# Patient Record
Sex: Female | Born: 1990
Health system: Southern US, Community
[De-identification: ages and names within clinical notes are randomized; demographics above are authoritative.]

## PROBLEM LIST (undated history)

## (undated) ENCOUNTER — Inpatient Hospital Stay (HOSPITAL_COMMUNITY): Payer: Self-pay

## (undated) DIAGNOSIS — N189 Chronic kidney disease, unspecified: Secondary | ICD-10-CM

## (undated) DIAGNOSIS — I10 Essential (primary) hypertension: Secondary | ICD-10-CM

## (undated) DIAGNOSIS — IMO0002 Reserved for concepts with insufficient information to code with codable children: Secondary | ICD-10-CM

## (undated) DIAGNOSIS — G43909 Migraine, unspecified, not intractable, without status migrainosus: Secondary | ICD-10-CM

## (undated) DIAGNOSIS — M329 Systemic lupus erythematosus, unspecified: Secondary | ICD-10-CM

## (undated) HISTORY — PX: TONSILLECTOMY: SUR1361

---

## 2013-04-27 ENCOUNTER — Encounter (HOSPITAL_COMMUNITY): Payer: Self-pay | Admitting: Emergency Medicine

## 2013-04-27 DIAGNOSIS — R109 Unspecified abdominal pain: Secondary | ICD-10-CM | POA: Insufficient documentation

## 2013-04-27 DIAGNOSIS — J3489 Other specified disorders of nose and nasal sinuses: Secondary | ICD-10-CM | POA: Insufficient documentation

## 2013-04-27 DIAGNOSIS — Z79899 Other long term (current) drug therapy: Secondary | ICD-10-CM | POA: Insufficient documentation

## 2013-04-27 DIAGNOSIS — Z3202 Encounter for pregnancy test, result negative: Secondary | ICD-10-CM | POA: Insufficient documentation

## 2013-04-27 DIAGNOSIS — J069 Acute upper respiratory infection, unspecified: Secondary | ICD-10-CM | POA: Insufficient documentation

## 2013-04-27 DIAGNOSIS — B9789 Other viral agents as the cause of diseases classified elsewhere: Secondary | ICD-10-CM | POA: Insufficient documentation

## 2013-04-27 LAB — COMPREHENSIVE METABOLIC PANEL
ALT: 8 U/L (ref 0–35)
AST: 18 U/L (ref 0–37)
Albumin: 2.8 g/dL — ABNORMAL LOW (ref 3.5–5.2)
Alkaline Phosphatase: 112 U/L (ref 39–117)
BUN: 8 mg/dL (ref 6–23)
CO2: 24 mEq/L (ref 19–32)
Calcium: 9.1 mg/dL (ref 8.4–10.5)
Chloride: 104 mEq/L (ref 96–112)
Creatinine, Ser: 0.62 mg/dL (ref 0.50–1.10)
GFR calc Af Amer: >90 mL/min (ref >90)
GFR calc non Af Amer: >90 mL/min (ref >90)
Glucose, Bld: 90 mg/dL (ref 70–99)
Potassium: 4 mEq/L (ref 3.5–5.1)
Sodium: 137 mEq/L (ref 135–145)
Total Bilirubin: 0.1 mg/dL — ABNORMAL LOW (ref 0.3–1.2)
Total Protein: 7.5 g/dL (ref 6.0–8.3)

## 2013-04-27 LAB — POCT PREGNANCY, URINE: Preg Test, Ur: NEGATIVE

## 2013-04-27 NOTE — ED Notes (Signed)
Pt is up to the desk c/o generalized abd pain and is concerned she may be pregnant, pt unsure of her last menstrual cycle, states "my last menstrual cycle was in December or January." Pt reports she was using Depo for [facility] and her last Depo injection was 2 weeks ago

## 2013-04-27 NOTE — ED Notes (Signed)
PT. REPORTS LEFT EAR ACHE WITH NASAL CONGESTION / WATERY EYES FOR SEVERAL DAYS , DENIES FEVER OR CHILLS.

## 2013-04-28 ENCOUNTER — Emergency Department (HOSPITAL_COMMUNITY)
Admission: EM | Admit: 2013-04-28 | Discharge: 2013-04-28 | Disposition: A | Discharge status: Home / Self Care | Payer: BC Managed Care – PPO | Attending: Emergency Medicine | Admitting: Emergency Medicine

## 2013-04-28 DIAGNOSIS — B349 Viral infection, unspecified: Secondary | ICD-10-CM

## 2013-04-28 DIAGNOSIS — J069 Acute upper respiratory infection, unspecified: Secondary | ICD-10-CM

## 2013-04-28 LAB — URINALYSIS, ROUTINE W REFLEX MICROSCOPIC
Bilirubin Urine: NEGATIVE
Glucose, UA: NEGATIVE mg/dL
Hgb urine dipstick: NEGATIVE
Ketones, ur: NEGATIVE mg/dL
Leukocytes, UA: NEGATIVE
Nitrite: NEGATIVE
Protein, ur: >300 mg/dL — AB
Specific Gravity, Urine: 1.02 (ref 1.005–1.030)
Urobilinogen, UA: 1 mg/dL (ref 0.0–1.0)
pH: 7 (ref 5.0–8.0)

## 2013-04-28 LAB — CBC WITH DIFFERENTIAL/PLATELET
Basophils Absolute: 0.1 10*3/uL (ref 0.0–0.1)
Basophils Relative: 1 % (ref 0–1)
Eosinophils Absolute: 1.8 10*3/uL — ABNORMAL HIGH (ref 0.0–0.7)
Eosinophils Relative: 16 % — ABNORMAL HIGH (ref 0–5)
HCT: 32.7 % — ABNORMAL LOW (ref 36.0–46.0)
Hemoglobin: 10.9 g/dL — ABNORMAL LOW (ref 12.0–15.0)
Lymphocytes Relative: 28 % (ref 12–46)
Lymphs Abs: 3.1 10*3/uL (ref 0.7–4.0)
MCH: 25.4 pg — ABNORMAL LOW (ref 26.0–34.0)
MCHC: 33.3 g/dL (ref 30.0–36.0)
MCV: 76.2 fL — ABNORMAL LOW (ref 78.0–100.0)
Monocytes Absolute: 1.1 10*3/uL — ABNORMAL HIGH (ref 0.1–1.0)
Monocytes Relative: 10 % (ref 3–12)
Neutro Abs: 5 10*3/uL (ref 1.7–7.7)
Neutrophils Relative %: 45 % (ref 43–77)
Platelets: 270 10*3/uL (ref 150–400)
RBC: 4.29 MIL/uL (ref 3.87–5.11)
RDW: 14.9 % (ref 11.5–15.5)
WBC: 11.1 10*3/uL — ABNORMAL HIGH (ref 4.0–10.5)

## 2013-04-28 LAB — URINE MICROSCOPIC-ADD ON

## 2013-04-28 MED ORDER — OXYMETAZOLINE HCL 0.05 % NA SOLN
1.0000 | Freq: Once | NASAL | Status: AC
  Administered 2013-04-28: 1 via NASAL
  Filled 2013-04-28: qty 15

## 2013-04-28 MED ORDER — OXYMETAZOLINE HCL 0.05 % NA SOLN: 2.0000 | Freq: Two times a day (BID) | NASAL | Status: DC

## 2013-04-28 MED ORDER — PSEUDOEPHEDRINE HCL ER 120 MG PO TB12: 120.0000 mg | ORAL_TABLET | Freq: Two times a day (BID) | ORAL | Status: DC

## 2013-04-28 MED ORDER — PSEUDOEPHEDRINE HCL ER 120 MG PO TB12
120.0000 mg | ORAL_TABLET | Freq: Once | ORAL | Status: AC
  Administered 2013-04-28: 120 mg via ORAL
  Filled 2013-04-28: qty 1

## 2013-04-28 NOTE — ED Notes (Signed)
MD at bedside. 

## 2013-04-28 NOTE — ED Notes (Signed)
The patient is AOx4 and comfortable with her discharge instructions. 

## 2013-04-29 NOTE — ED Provider Notes (Signed)
History     CSN: [id number]  Arrival date & time 04/27/13  2128   First MD Initiated Contact with Patient 04/28/13 [phone]      Chief Complaint  Patient presents with  . Otalgia  . Nasal Congestion  . Abdominal Pain    (Consider location/radiation/quality/duration/timing/severity/associated sxs/prior treatment) HPI 22 yo female presents to the ER with c/o 2-3 days of nasal congestion, watery eyes.  She has had sore throat, worse when lying down.  Today developed left ear pain.  Subjective fevers.  No cough.  No chest pain.  No allergies.  No sick contacts.  Pt also c/o no menses for several months.  No longer on Depo.  History reviewed. No pertinent past medical history.  History reviewed. No pertinent past surgical history.  No family history on file.  History  Substance Use Topics  . Smoking status: Never Smoker   . Smokeless tobacco: Not on file  . Alcohol Use: No    OB History   Grav Para Term Preterm Abortions TAB SAB Ect Mult Living                  Review of Systems  All other systems reviewed and are negative.    Allergies  Review of patient's allergies indicates no known allergies.  Home Medications   Current Outpatient Rx  Name  Route  Sig  Dispense  Refill  . OVER THE COUNTER MEDICATION   Oral   Take 1 tablet by mouth daily. Sinus medication         . oxymetazoline (AFRIN) 0.05 % nasal spray   Nasal   Place 2 sprays into the nose 2 (two) times daily.   30 mL   0   . pseudoephedrine (SUDAFED) 120 MG 12 hr tablet   Oral   Take 1 tablet (120 mg total) by mouth 2 (two) times daily.   60 tablet   0     BP 104/64  Pulse 66  Temp(Src) 97.8 F (36.6 C) (Oral)  Resp 19  Ht 5' 1.5" (1.562 m)  Wt 140 lb (63.504 kg)  BMI 26.03 kg/m2  SpO2 99%  Physical Exam  Nursing note and vitals reviewed. Constitutional: She is oriented to person, place, and time. She appears well-developed and well-nourished.  HENT:  Head: Normocephalic and  atraumatic.  Right Ear: External ear normal.  Left Ear: External ear normal.  Mouth/Throat: Oropharynx is clear and moist.  rhinorrhea  Eyes: Conjunctivae and EOM are normal. Pupils are equal, round, and reactive to light.  Neck: Normal range of motion. Neck supple. No JVD present. No tracheal deviation present. No thyromegaly present.  Cardiovascular: Normal rate, regular rhythm, normal heart sounds and intact distal pulses.  Exam reveals no gallop and no friction rub.   No murmur heard. Pulmonary/Chest: Effort normal and breath sounds normal. No stridor. No respiratory distress. She has no wheezes. She has no rales. She exhibits no tenderness.  Abdominal: Soft. Bowel sounds are normal. She exhibits no distension and no mass. There is no tenderness. There is no rebound and no guarding.  Musculoskeletal: Normal range of motion. She exhibits no edema and no tenderness.  Lymphadenopathy:    She has no cervical adenopathy.  Neurological: She is alert and oriented to person, place, and time. She exhibits normal muscle tone. Coordination normal.  Skin: Skin is warm and dry. No rash noted. No erythema. No pallor.  Psychiatric: She has a normal mood and affect. Her behavior is normal. Judgment  and thought content normal.    ED Course  Procedures (including critical care time)  Labs Reviewed  CBC WITH DIFFERENTIAL - Abnormal; Notable for the following:    WBC 11.1 (*)    Hemoglobin 10.9 (*)    HCT 32.7 (*)    MCV 76.2 (*)    MCH 25.4 (*)    Eosinophils Relative 16 (*)    Monocytes Absolute 1.1 (*)    Eosinophils Absolute 1.8 (*)    All other components within normal limits  COMPREHENSIVE METABOLIC PANEL - Abnormal; Notable for the following:    Albumin 2.8 (*)    Total Bilirubin 0.1 (*)    All other components within normal limits  URINALYSIS, ROUTINE W REFLEX MICROSCOPIC - Abnormal; Notable for the following:    Protein, ur >300 (*)    All other components within normal limits  URINE  MICROSCOPIC-ADD ON - Abnormal; Notable for the following:    Squamous Epithelial / LPF FEW (*)    All other components within normal limits  POCT PREGNANCY, URINE   No results found.   1. Viral illness   2. Upper respiratory infection       MDM  Pt with URI by history and exam.  Pt educated on expected course and treatment.  Urine pregnancy is negative.       Olivia Mackie, MD 04/29/13 1351

## 2014-04-22 ENCOUNTER — Other Ambulatory Visit (HOSPITAL_COMMUNITY)
Admission: RE | Admit: 2014-04-22 | Discharge: 2014-04-22 | Disposition: A | Discharge status: Home / Self Care | Payer: BC Managed Care – PPO | Source: Ambulatory Visit | Attending: Nurse Practitioner | Admitting: Nurse Practitioner

## 2014-04-22 ENCOUNTER — Other Ambulatory Visit: Payer: Self-pay | Admitting: Nurse Practitioner

## 2014-04-22 DIAGNOSIS — Z124 Encounter for screening for malignant neoplasm of cervix: Secondary | ICD-10-CM | POA: Insufficient documentation

## 2014-04-22 DIAGNOSIS — Z113 Encounter for screening for infections with a predominantly sexual mode of transmission: Secondary | ICD-10-CM | POA: Insufficient documentation

## 2014-11-25 ENCOUNTER — Emergency Department (HOSPITAL_COMMUNITY)
Admission: EM | Admit: 2014-11-25 | Discharge: 2014-11-25 | Disposition: A | Discharge status: Home / Self Care | Payer: BC Managed Care – PPO | Attending: Emergency Medicine | Admitting: Emergency Medicine

## 2014-11-25 ENCOUNTER — Emergency Department (HOSPITAL_COMMUNITY): Payer: BC Managed Care – PPO

## 2014-11-25 DIAGNOSIS — R1030 Lower abdominal pain, unspecified: Secondary | ICD-10-CM | POA: Diagnosis present

## 2014-11-25 DIAGNOSIS — Z3202 Encounter for pregnancy test, result negative: Secondary | ICD-10-CM | POA: Insufficient documentation

## 2014-11-25 DIAGNOSIS — N739 Female pelvic inflammatory disease, unspecified: Secondary | ICD-10-CM | POA: Diagnosis not present

## 2014-11-25 DIAGNOSIS — N938 Other specified abnormal uterine and vaginal bleeding: Secondary | ICD-10-CM | POA: Diagnosis not present

## 2014-11-25 DIAGNOSIS — Z79899 Other long term (current) drug therapy: Secondary | ICD-10-CM | POA: Insufficient documentation

## 2014-11-25 LAB — COMPREHENSIVE METABOLIC PANEL
ALT: 11 U/L (ref 0–35)
AST: 19 U/L (ref 0–37)
Albumin: 3.3 g/dL — ABNORMAL LOW (ref 3.5–5.2)
Alkaline Phosphatase: 90 U/L (ref 39–117)
Anion gap: 5 (ref 5–15)
BUN: 11 mg/dL (ref 6–23)
CO2: 23 mmol/L (ref 19–32)
Calcium: 9 mg/dL (ref 8.4–10.5)
Chloride: 111 mEq/L (ref 96–112)
Creatinine, Ser: 0.49 mg/dL — ABNORMAL LOW (ref 0.50–1.10)
GFR calc Af Amer: >90 mL/min (ref >90)
GFR calc non Af Amer: >90 mL/min (ref >90)
Glucose, Bld: 90 mg/dL (ref 70–99)
Potassium: 3.4 mmol/L — ABNORMAL LOW (ref 3.5–5.1)
Sodium: 139 mmol/L (ref 135–145)
Total Bilirubin: 0.4 mg/dL (ref 0.3–1.2)
Total Protein: 7.2 g/dL (ref 6.0–8.3)

## 2014-11-25 LAB — CBC WITH DIFFERENTIAL/PLATELET
Basophils Absolute: 0 10*3/uL (ref 0.0–0.1)
Basophils Relative: 0 % (ref 0–1)
Eosinophils Absolute: 1.3 10*3/uL — ABNORMAL HIGH (ref 0.0–0.7)
Eosinophils Relative: 11 % — ABNORMAL HIGH (ref 0–5)
HCT: 32.5 % — ABNORMAL LOW (ref 36.0–46.0)
Hemoglobin: 10.3 g/dL — ABNORMAL LOW (ref 12.0–15.0)
Lymphocytes Relative: 18 % (ref 12–46)
Lymphs Abs: 2.2 10*3/uL (ref 0.7–4.0)
MCH: 24.9 pg — ABNORMAL LOW (ref 26.0–34.0)
MCHC: 31.7 g/dL (ref 30.0–36.0)
MCV: 78.7 fL (ref 78.0–100.0)
Monocytes Absolute: 0.7 10*3/uL (ref 0.1–1.0)
Monocytes Relative: 6 % (ref 3–12)
Neutro Abs: 8.3 10*3/uL — ABNORMAL HIGH (ref 1.7–7.7)
Neutrophils Relative %: 65 % (ref 43–77)
Platelets: 299 10*3/uL (ref 150–400)
RBC: 4.13 MIL/uL (ref 3.87–5.11)
RDW: 15.7 % — ABNORMAL HIGH (ref 11.5–15.5)
WBC: 12.6 10*3/uL — ABNORMAL HIGH (ref 4.0–10.5)

## 2014-11-25 LAB — URINALYSIS, ROUTINE W REFLEX MICROSCOPIC
Bilirubin Urine: NEGATIVE
Glucose, UA: NEGATIVE mg/dL
Ketones, ur: NEGATIVE mg/dL
Nitrite: NEGATIVE
Protein, ur: 30 mg/dL — AB
Specific Gravity, Urine: 1.019 (ref 1.005–1.030)
Urobilinogen, UA: 0.2 mg/dL (ref 0.0–1.0)
pH: 7.5 (ref 5.0–8.0)

## 2014-11-25 LAB — WET PREP, GENITAL
Clue Cells Wet Prep HPF POC: NONE SEEN
Trich, Wet Prep: NONE SEEN
WBC, Wet Prep HPF POC: NONE SEEN
Yeast Wet Prep HPF POC: NONE SEEN

## 2014-11-25 LAB — POC URINE PREG, ED: Preg Test, Ur: NEGATIVE

## 2014-11-25 LAB — URINE MICROSCOPIC-ADD ON

## 2014-11-25 MED ORDER — METRONIDAZOLE 500 MG PO TABS: 500.0000 mg | ORAL_TABLET | Freq: Two times a day (BID) | ORAL | Status: DC

## 2014-11-25 MED ORDER — DOXYCYCLINE HYCLATE 100 MG PO TABS
100.0000 mg | ORAL_TABLET | Freq: Once | ORAL | Status: AC
  Administered 2014-11-25: 100 mg via ORAL
  Filled 2014-11-25: qty 1

## 2014-11-25 MED ORDER — DOXYCYCLINE HYCLATE 100 MG PO CAPS: 100.0000 mg | ORAL_CAPSULE | Freq: Two times a day (BID) | ORAL | Status: DC

## 2014-11-25 MED ORDER — METRONIDAZOLE 500 MG PO TABS
500.0000 mg | ORAL_TABLET | Freq: Once | ORAL | Status: AC
  Administered 2014-11-25: 500 mg via ORAL
  Filled 2014-11-25: qty 1

## 2014-11-25 MED ORDER — CEFTRIAXONE SODIUM 250 MG IJ SOLR
250.0000 mg | Freq: Once | INTRAMUSCULAR | Status: AC
  Administered 2014-11-25: 250 mg via INTRAMUSCULAR
  Filled 2014-11-25: qty 250

## 2014-11-25 MED ORDER — HYDROCODONE-ACETAMINOPHEN 5-325 MG PO TABS
2.0000 | ORAL_TABLET | Freq: Once | ORAL | Status: AC
  Administered 2014-11-25: 2 via ORAL
  Filled 2014-11-25: qty 2

## 2014-11-25 MED ORDER — LIDOCAINE HCL 1 % IJ SOLN
INTRAMUSCULAR | Status: AC
  Administered 2014-11-25: 0.9 mL
  Filled 2014-11-25: qty 20

## 2014-11-25 MED ORDER — HYDROCODONE-ACETAMINOPHEN 5-325 MG PO TABS: 1.0000 | ORAL_TABLET | ORAL | Status: DC | PRN

## 2014-11-25 NOTE — Progress Notes (Signed)
  CARE MANAGEMENT ED NOTE 11/25/2014  Patient:  Schuyler AmorJONES,Marda   Account Number:  [id number]  Date Initiated:  11/25/2014  Documentation initiated by:  Radford PaxFERRERO,Collen Hostler  Subjective/Objective Assessment:   Patient presents to Ed with abdominal pain headache and vaginal bleeding     Subjective/Objective Assessment Detail:     Action/Plan:   Action/Plan Detail:   Anticipated DC Date:  11/25/2014     Status Recommendation to Physician:   Result of Recommendation:    Other ED Services  Consult Working Plan    DC Planning Services  Other  PCP issues    Choice offered to / List presented to:            Status of service:  Completed, signed off  ED Comments:   ED Comments Detail:  [facility]  [facility].  Patient listed as having [employer] insurnace without pcp. As per EDP, patient reports she does not have [employer]BS insurnace anymore and her new insurnace does nto start until Feb.   EDCM spoke to registration who reports patient is active with [employer] insurnace until 12/26/2014.  EDCM spke to patient at bedisde.  [facility].  Patient was not aware of this and reports [facility]  Patient reports her pcp is Dr. Eula ListenHussain.  EDCM unable to find this provider.  No further EDCM needs at this time.

## 2014-11-25 NOTE — ED Notes (Signed)
Pt c/o cramping lower abdominal pain, headache. Pt states she is currently having spotting vaginal bleeding, congruent with her irregular periods s/t oral contraceptives. Denies emesis, diarrhea, constipation.

## 2014-11-25 NOTE — ED Provider Notes (Signed)
CSN: [id number]     Arrival date & time 11/25/14  1347 History   First MD Initiated Contact with Patient 11/25/14 1356     Chief Complaint  Patient presents with  . Abdominal Pain     (Consider location/radiation/quality/duration/timing/severity/associated sxs/prior Treatment) HPI  23 year old female presents with 3 days of lower abdominal pain. Rates it as severe. The pain is constant. The pain has been in her lower abdomen and did not start anywhere else. Does not radiate. The pain is worst suprapubic/pelvic. Denies any nausea, vomiting, back pain, or fever. No diarrhea. She has irregular periods due to her birth control, did start to spot this morning. Is unsure if she's pregnant. Denies any vaginal discharge. Has tried ibuprofen without relief.  No past medical history on file. No past surgical history on file. No family history on file. History  Substance Use Topics  . Smoking status: Never Smoker   . Smokeless tobacco: Not on file  . Alcohol Use: No   OB History    No data available     Review of Systems  Constitutional: Negative for fever.  Gastrointestinal: Positive for abdominal pain. Negative for nausea, vomiting, diarrhea and constipation.  Genitourinary: Positive for vaginal bleeding. Negative for dysuria, hematuria, decreased urine volume and vaginal discharge.  Musculoskeletal: Negative for back pain.  All other systems reviewed and are negative.     Allergies  Review of patient's allergies indicates no known allergies.  Home Medications   Prior to Admission medications   Medication Sig Start Date End Date Taking? Authorizing Provider  OVER THE COUNTER MEDICATION Take 1 tablet by mouth daily. Sinus medication    Historical Provider, MD  oxymetazoline (AFRIN) 0.05 % nasal spray Place 2 sprays into the nose 2 (two) times daily. 04/28/13   Olivia Mackielga M Otter, MD  pseudoephedrine (SUDAFED) 120 MG 12 hr tablet Take 1 tablet (120 mg total) by mouth 2 (two) times daily.  04/28/13   Olivia Mackielga M Otter, MD   BP 140/85 mmHg  Pulse 90  Temp(Src) 98.4 F (36.9 C) (Oral)  Resp 16  SpO2 100%  LMP 11/25/2014 Physical Exam  Constitutional: She is oriented to person, place, and time. She appears well-developed and well-nourished.  HENT:  Head: Normocephalic and atraumatic.  Right Ear: External ear normal.  Left Ear: External ear normal.  Nose: Nose normal.  Eyes: Right eye exhibits no discharge. Left eye exhibits no discharge.  Cardiovascular: Normal rate, regular rhythm and normal heart sounds.   Pulmonary/Chest: Effort normal and breath sounds normal.  Abdominal: Soft. There is tenderness in the suprapubic area. There is no CVA tenderness.  Genitourinary: Uterus is tender. Uterus is not enlarged. Cervix exhibits motion tenderness, discharge and friability. Right adnexum displays no mass. Left adnexum displays no mass.  Neurological: She is alert and oriented to person, place, and time.  Skin: Skin is warm and dry.  Nursing note and vitals reviewed.   ED Course  Procedures (including critical care time) Labs Review Labs Reviewed  CBC WITH DIFFERENTIAL - Abnormal; Notable for the following:    WBC 12.6 (*)    Hemoglobin 10.3 (*)    HCT 32.5 (*)    MCH 24.9 (*)    RDW 15.7 (*)    Neutro Abs 8.3 (*)    Eosinophils Relative 11 (*)    Eosinophils Absolute 1.3 (*)    All other components within normal limits  COMPREHENSIVE METABOLIC PANEL - Abnormal; Notable for the following:    Potassium 3.4 (*)  Creatinine, Ser 0.49 (*)    Albumin 3.3 (*)    All other components within normal limits  URINALYSIS, ROUTINE W REFLEX MICROSCOPIC - Abnormal; Notable for the following:    Hgb urine dipstick MODERATE (*)    Protein, ur 30 (*)    Leukocytes, UA SMALL (*)    All other components within normal limits  WET PREP, GENITAL  GC/CHLAMYDIA PROBE AMP  URINE MICROSCOPIC-ADD ON  POC URINE PREG, ED    Imaging Review [place] Transvaginal Non-ob  11/25/2014   CLINICAL  DATA:  Pelvic pain for 2 days. Irregular menstrual bleeding. Pelvic inflammatory disease.  EXAM: TRANSABDOMINAL AND TRANSVAGINAL ULTRASOUND OF PELVIS  TECHNIQUE: Both transabdominal and transvaginal ultrasound examinations of the pelvis were performed. Transabdominal technique was performed for global imaging of the pelvis including uterus, ovaries, adnexal regions, and pelvic cul-de-sac. It was necessary to proceed with endovaginal exam following the transabdominal exam to visualize the uterus, ovaries, and adnexa.  COMPARISON:  None  FINDINGS: Uterus  Measurements: Retroverted measuring 7.8 x 3.8 x 4.0 cm. No fibroids or other mass visualized. The uterus is best assessed transvaginally.  Endometrium  Thickness: 3 mm.  No focal abnormality visualized.  Right ovary  Measurements: 4.0 x 2.3 x 2.8 cm. There is a follicular 1.4 cm cyst. Normal blood flow seen. Adjacent to the right ovary in the right adnexa is 1.7 x 1.6 x 2.0 cm complex regional with peripheral blood flow and small central hypoechoic region as well as an adjacent tubular echogenic structure.  Left ovary  Measurements: 3.0 x 1.4 3.1 cm. There is a 1.5 cm follicular cyst. Normal blood flow. No adnexal mass.  Other findings  Small amount of simple free fluid.  IMPRESSION: 1. Complex right adnexal process with echogenic tubular structure and complex region adjacent to the right ovary that may reflect a small 2 cm abscess, given the clinical concern for pelvic inflammatory disease. Small amount of simple free fluid in the pelvis. 2. There is normal blood flow to both ovaries, no torsion. Retroverted but normal appearance of the uterus.   Electronically Signed   By: Rubye OaksMelanie  Ehinger M.D.   On: 11/25/2014 15:55   [place] Pelvis Complete  11/25/2014   CLINICAL DATA:  Pelvic pain for 2 days. Irregular menstrual bleeding. Pelvic inflammatory disease.  EXAM: TRANSABDOMINAL AND TRANSVAGINAL ULTRASOUND OF PELVIS  TECHNIQUE: Both transabdominal and transvaginal  ultrasound examinations of the pelvis were performed. Transabdominal technique was performed for global imaging of the pelvis including uterus, ovaries, adnexal regions, and pelvic cul-de-sac. It was necessary to proceed with endovaginal exam following the transabdominal exam to visualize the uterus, ovaries, and adnexa.  COMPARISON:  None  FINDINGS: Uterus  Measurements: Retroverted measuring 7.8 x 3.8 x 4.0 cm. No fibroids or other mass visualized. The uterus is best assessed transvaginally.  Endometrium  Thickness: 3 mm.  No focal abnormality visualized.  Right ovary  Measurements: 4.0 x 2.3 x 2.8 cm. There is a follicular 1.4 cm cyst. Normal blood flow seen. Adjacent to the right ovary in the right adnexa is 1.7 x 1.6 x 2.0 cm complex regional with peripheral blood flow and small central hypoechoic region as well as an adjacent tubular echogenic structure.  Left ovary  Measurements: 3.0 x 1.4 3.1 cm. There is a 1.5 cm follicular cyst. Normal blood flow. No adnexal mass.  Other findings  Small amount of simple free fluid.  IMPRESSION: 1. Complex right adnexal process with echogenic tubular structure and complex region adjacent to  the right ovary that may reflect a small 2 cm abscess, given the clinical concern for pelvic inflammatory disease. Small amount of simple free fluid in the pelvis. 2. There is normal blood flow to both ovaries, no torsion. Retroverted but normal appearance of the uterus.   Electronically Signed   By: Rubye Oaks M.D.   On: 11/25/2014 15:55     EKG Interpretation None      MDM   Final diagnoses:  Pelvic inflammatory disease    Patient symptoms are consistent with PID. Her pain is controlled with oral narcotics. She has no rebound tenderness and does not appear ill. No fevers. Discussed ultrasound results, which show a small possible abscess, with OB/GYN, Dr. Jolayne Panther, who recommends oral antibiotics x 2 weeks at this time with oral pain control and f/u with GYN in 2  weeks. Patient agreeable to plan and understands strict return precautions.     Audree Camel, MD 11/25/14 [phone]

## 2014-11-28 LAB — GC/CHLAMYDIA PROBE AMP
CT Probe RNA: NEGATIVE
GC Probe RNA: POSITIVE — AB

## 2014-11-29 ENCOUNTER — Encounter: Payer: Self-pay | Admitting: Family Medicine

## 2014-11-29 ENCOUNTER — Telehealth: Payer: Self-pay

## 2014-11-29 DIAGNOSIS — N7093 Salpingitis and oophoritis, unspecified: Secondary | ICD-10-CM

## 2014-11-29 NOTE — Telephone Encounter (Signed)
F/u U/S scheduled for Tuesday 12/14/14 0915. Attempted to contact patient regarding appointment. Heard busy tone. Will attempt again later. Message sent to admin pool to schedule patient f/u GYN appointment for later that week or the following week; they will call patient with clinic appointment.

## 2014-11-29 NOTE — Telephone Encounter (Signed)
-----   Message from Catalina Antigua, MD sent at 11/25/2014  4:26 PM EST ----- Please schedule repeat ultrasound a few days prior to follow appointment (which should be scheduled in 3-4 weeks) for the evaluation of a right TOA  Thanks  Peggy

## 2014-11-30 ENCOUNTER — Encounter: Payer: Self-pay | Admitting: General Practice

## 2014-11-30 ENCOUNTER — Telehealth (HOSPITAL_BASED_OUTPATIENT_CLINIC_OR_DEPARTMENT_OTHER): Payer: Self-pay | Admitting: Emergency Medicine

## 2014-11-30 ENCOUNTER — Telehealth: Payer: Self-pay | Admitting: General Practice

## 2014-11-30 NOTE — Telephone Encounter (Signed)
Called patient and number had been disconnected. Called patient's contact number for mother, she answered stating she wasn't with Burnett HarryShelly but would tell her to give us a call. Will send letter

## 2014-11-30 NOTE — Telephone Encounter (Signed)
Patient called back returning our call. I informed patient of appointments and she stated she wants them cancelled she already has an ob/gyn. Discussed with patient importance of follow up. Patient verbalized understanding and had no questions

## 2014-12-08 ENCOUNTER — Telehealth (HOSPITAL_COMMUNITY): Payer: Self-pay

## 2014-12-08 NOTE — ED Notes (Signed)
Unable to contact pt by mail or telephone. Unable to communicate lab results or treatment changes. 

## 2014-12-14 ENCOUNTER — Ambulatory Visit (HOSPITAL_COMMUNITY): Payer: BC Managed Care – PPO

## 2014-12-14 ENCOUNTER — Encounter: Payer: Self-pay | Admitting: General Practice

## 2014-12-15 ENCOUNTER — Ambulatory Visit: Payer: Self-pay | Admitting: Obstetrics & Gynecology

## 2014-12-29 ENCOUNTER — Encounter: Payer: Self-pay | Admitting: Family Medicine

## 2015-01-24 ENCOUNTER — Telehealth (HOSPITAL_COMMUNITY): Payer: Self-pay

## 2015-01-24 NOTE — Telephone Encounter (Signed)
STD treatment requested [facility] 

## 2015-09-26 ENCOUNTER — Emergency Department (HOSPITAL_COMMUNITY)
Admission: EM | Admit: 2015-09-26 | Discharge: 2015-09-26 | Disposition: A | Discharge status: Home / Self Care | Payer: 59 | Attending: Emergency Medicine | Admitting: Emergency Medicine

## 2015-09-26 ENCOUNTER — Emergency Department (HOSPITAL_COMMUNITY): Payer: 59

## 2015-09-26 ENCOUNTER — Encounter (HOSPITAL_COMMUNITY): Payer: Self-pay | Admitting: Family Medicine

## 2015-09-26 DIAGNOSIS — R000 Tachycardia, unspecified: Secondary | ICD-10-CM | POA: Diagnosis not present

## 2015-09-26 DIAGNOSIS — Z79899 Other long term (current) drug therapy: Secondary | ICD-10-CM | POA: Insufficient documentation

## 2015-09-26 DIAGNOSIS — L298 Other pruritus: Secondary | ICD-10-CM | POA: Diagnosis not present

## 2015-09-26 DIAGNOSIS — R0602 Shortness of breath: Secondary | ICD-10-CM | POA: Diagnosis present

## 2015-09-26 DIAGNOSIS — Z3202 Encounter for pregnancy test, result negative: Secondary | ICD-10-CM | POA: Diagnosis not present

## 2015-09-26 DIAGNOSIS — J189 Pneumonia, unspecified organism: Secondary | ICD-10-CM

## 2015-09-26 DIAGNOSIS — J159 Unspecified bacterial pneumonia: Secondary | ICD-10-CM | POA: Insufficient documentation

## 2015-09-26 LAB — COMPREHENSIVE METABOLIC PANEL
ALT: 14 U/L (ref 14–54)
AST: 22 U/L (ref 15–41)
Albumin: 3 g/dL — ABNORMAL LOW (ref 3.5–5.0)
Alkaline Phosphatase: 72 U/L (ref 38–126)
Anion gap: 3 — ABNORMAL LOW (ref 5–15)
BUN: 10 mg/dL (ref 6–20)
CO2: 25 mmol/L (ref 22–32)
Calcium: 8.7 mg/dL — ABNORMAL LOW (ref 8.9–10.3)
Chloride: 108 mmol/L (ref 101–111)
Creatinine, Ser: 0.58 mg/dL (ref 0.44–1.00)
GFR calc Af Amer: >60 mL/min (ref >60)
GFR calc non Af Amer: >60 mL/min (ref >60)
Glucose, Bld: 109 mg/dL — ABNORMAL HIGH (ref 65–99)
Potassium: 3.4 mmol/L — ABNORMAL LOW (ref 3.5–5.1)
Sodium: 136 mmol/L (ref 135–145)
Total Bilirubin: 0.5 mg/dL (ref 0.3–1.2)
Total Protein: 6.8 g/dL (ref 6.5–8.1)

## 2015-09-26 LAB — CBC WITH DIFFERENTIAL/PLATELET
Basophils Absolute: 0 10*3/uL (ref 0.0–0.1)
Basophils Relative: 0 %
Eosinophils Absolute: 0.1 10*3/uL (ref 0.0–0.7)
Eosinophils Relative: 1 %
HCT: 37.3 % (ref 36.0–46.0)
Hemoglobin: 12.2 g/dL (ref 12.0–15.0)
Lymphocytes Relative: 21 %
Lymphs Abs: 2 10*3/uL (ref 0.7–4.0)
MCH: 27.1 pg (ref 26.0–34.0)
MCHC: 32.7 g/dL (ref 30.0–36.0)
MCV: 82.9 fL (ref 78.0–100.0)
Monocytes Absolute: 0.8 10*3/uL (ref 0.1–1.0)
Monocytes Relative: 9 %
Neutro Abs: 6.6 10*3/uL (ref 1.7–7.7)
Neutrophils Relative %: 69 %
Platelets: 216 10*3/uL (ref 150–400)
RBC: 4.5 MIL/uL (ref 3.87–5.11)
RDW: 16 % — ABNORMAL HIGH (ref 11.5–15.5)
WBC: 9.6 10*3/uL (ref 4.0–10.5)

## 2015-09-26 LAB — D-DIMER, QUANTITATIVE: D-Dimer, Quant: 1.69 ug/mL-FEU — ABNORMAL HIGH (ref 0.00–0.48)

## 2015-09-26 LAB — I-STAT BETA HCG BLOOD, ED (MC, WL, AP ONLY): I-stat hCG, quantitative: <5 m[IU]/mL (ref <5)

## 2015-09-26 MED ORDER — ALBUTEROL SULFATE HFA 108 (90 BASE) MCG/ACT IN AERS
1.0000 | INHALATION_SPRAY | RESPIRATORY_TRACT | Status: DC | PRN
Start: 2015-09-26 — End: 2015-09-26
  Administered 2015-09-26: 1 via RESPIRATORY_TRACT
  Filled 2015-09-26: qty 6.7

## 2015-09-26 MED ORDER — IOHEXOL 350 MG/ML SOLN
100.0000 mL | Freq: Once | INTRAVENOUS | Status: AC | PRN
  Administered 2015-09-26: 100 mL via INTRAVENOUS

## 2015-09-26 MED ORDER — AZITHROMYCIN 250 MG PO TABS: 250.0000 mg | ORAL_TABLET | Freq: Every day | ORAL | Status: DC

## 2015-09-26 MED ORDER — ALBUTEROL SULFATE (2.5 MG/3ML) 0.083% IN NEBU
5.0000 mg | INHALATION_SOLUTION | Freq: Once | RESPIRATORY_TRACT | Status: AC
  Administered 2015-09-26: 5 mg via RESPIRATORY_TRACT
  Filled 2015-09-26: qty 6

## 2015-09-26 MED ORDER — HYDROXYZINE HCL 25 MG PO TABS
25.0000 mg | ORAL_TABLET | Freq: Once | ORAL | Status: AC
  Administered 2015-09-26: 25 mg via ORAL
  Filled 2015-09-26: qty 1

## 2015-09-26 NOTE — ED Notes (Signed)
Pt states she is experiencing shortness of breath. Respirations are even, regular, and unlabored. Denies fever. Coughing: congested, productive. Pt reports itching all over with rash. Pt reports taking Benadryl 2 tabs at 19:00 with some relief.

## 2015-09-26 NOTE — ED Provider Notes (Signed)
CSN: [id number]     Arrival date & [id number]  0556 History   First MD Initiated Contact with Patient 09/26/15 [phone]     Chief Complaint  Patient presents with  . Shortness of Breath  . Pruritis     (Consider location/radiation/quality/duration/timing/severity/associated sxs/prior Treatment) HPI Comments: 24 y.o. Female with no significant past medical history, with implanon in place presents for shortness of breath.  The patient reports that this started yesterday and has been worsening since that time.  She does report a cough that has been non productive.  No congestion, nasal discharge, headache, fever, chills.  No leg pain or swelling.  She denies symptoms like this before but does report a history of bronchitis.  The patient was given a breathing treatment in triage after which she felt improved but reports she still feels short of breath at this time.    Patient is a 24 y.o. female presenting with shortness of breath.  Shortness of Breath Associated symptoms: cough   Associated symptoms: no abdominal pain, no chest pain, no rash, no vomiting and no wheezing     History reviewed. No pertinent past medical history. History reviewed. No pertinent past surgical history. History reviewed. No pertinent family history. Social History  Substance Use Topics  . Smoking status: Never Smoker   . Smokeless tobacco: None  . Alcohol Use: No   OB History    No data available     Review of Systems  Constitutional: Negative for chills, appetite change and fatigue.  HENT: Negative for congestion, postnasal drip and rhinorrhea.   Respiratory: Positive for cough and shortness of breath. Negative for wheezing and stridor.   Cardiovascular: Negative for chest pain and palpitations.  Gastrointestinal: Negative for nausea, vomiting, abdominal pain and diarrhea.  Genitourinary: Negative for dysuria, urgency and hematuria.  Musculoskeletal: Negative for myalgias and back pain.  Skin: Negative  for rash.  Neurological: Negative for dizziness and light-headedness.  Hematological: Negative for adenopathy. Does not bruise/bleed easily.      Allergies  Review of patient's allergies indicates no known allergies.  Home Medications   Prior to Admission medications   Medication Sig Start Date End Date Taking? Authorizing Provider  diphenhydrAMINE (BENADRYL) 25 MG tablet Take 50 mg by mouth every 6 (six) hours as needed for itching or allergies.    Yes Historical Provider, MD  etonogestrel (IMPLANON) 68 MG IMPL implant 1 each by Subdermal route once. 04/2014   Yes Historical Provider, MD  ibuprofen (ADVIL,MOTRIN) 200 MG tablet Take 400 mg by mouth every 6 (six) hours as needed (For headache, pain or fever.).    Yes Historical Provider, MD  azithromycin (ZITHROMAX Z-PAK) 250 MG tablet Take 1 tablet (250 mg total) by mouth daily. Take two tablets today and then one tablet daily for the next 4 days 09/26/15   Leta Baptist, MD  HYDROcodone-acetaminophen [facility]) 5-325 MG per tablet Take 1 tablet by mouth every 4 (four) hours as needed for severe pain. Patient not taking: Reported on 09/26/2015 11/25/14   Pricilla Loveless, MD  oxymetazoline (AFRIN) 0.05 % nasal spray Place 2 sprays into the nose 2 (two) times daily. Patient not taking: Reported on 11/25/2014 04/28/13   Marisa Severin, MD  pseudoephedrine (SUDAFED) 120 MG 12 hr tablet Take 1 tablet (120 mg total) by mouth 2 (two) times daily. Patient not taking: Reported on 11/25/2014 04/28/13   Marisa Severin, MD   BP 127/87 mmHg  Pulse 88  Temp(Src) 97.9 F (36.6 C) (Oral)  Resp 20  Ht 5\' 2"  (1.575 m)  Wt 162 lb (73.483 kg)  BMI 29.62 kg/m2  SpO2 98%  LMP 09/25/2015 Physical Exam  Constitutional: She is oriented to person, place, and time. She appears well-developed and well-nourished. No distress.  HENT:  Head: Normocephalic and atraumatic.  Right Ear: External ear normal.  Left Ear: External ear normal.  Nose: Nose normal.   Mouth/Throat: Oropharynx is clear and moist. No oropharyngeal exudate.  Eyes: EOM are normal. Pupils are equal, round, and reactive to light.  Neck: Normal range of motion. Neck supple.  Cardiovascular: Regular rhythm, normal heart sounds and intact distal pulses.  Tachycardia present.   No murmur heard. Pulmonary/Chest: Effort normal. No respiratory distress. She has no wheezes. She has no rales.  Abdominal: Soft. She exhibits no distension. There is no tenderness.  Musculoskeletal: Normal range of motion. She exhibits no edema or tenderness.  Neurological: She is alert and oriented to person, place, and time.  Skin: Skin is warm and dry. No rash noted. She is not diaphoretic.  Vitals reviewed.   ED Course  Procedures (including critical care time) Labs Review Labs Reviewed  CBC WITH DIFFERENTIAL/PLATELET - Abnormal; Notable for the following:    RDW 16.0 (*)    All other components within normal limits  COMPREHENSIVE METABOLIC PANEL - Abnormal; Notable for the following:    Potassium 3.4 (*)    Glucose, Bld 109 (*)    Calcium 8.7 (*)    Albumin 3.0 (*)    Anion gap 3 (*)    All other components within normal limits  D-DIMER, QUANTITATIVE (NOT AT [facility]) - Abnormal; Notable for the following:    D-Dimer, Quant 1.69 (*)    All other components within normal limits  I-STAT BETA HCG BLOOD, ED ([facility], [facility], [facility] ONLY)    Imaging Review Dg Chest 2 View  09/26/2015  CLINICAL DATA:  New onset dyspnea, worsening over night. EXAM: CHEST  2 VIEW COMPARISON:  None. FINDINGS: The heart size and mediastinal contours are within normal limits. Both lungs are clear. The visualized skeletal structures are unremarkable. IMPRESSION: No active cardiopulmonary disease. Electronically Signed   By: Ellery Plunkaniel R Mitchell M.D.   On: 09/26/2015 06:48   Ct Angio Chest Pe W/cm &/or Wo Cm  09/26/2015  CLINICAL DATA:  Shortness of breath beginning yesterday. Elevated D-dimer. EXAM: CT ANGIOGRAPHY CHEST WITH CONTRAST  TECHNIQUE: Multidetector CT imaging of the chest was performed using the standard protocol during bolus administration of intravenous contrast. Multiplanar CT image reconstructions and MIPs were obtained to evaluate the vascular anatomy. CONTRAST:  100mL OMNIPAQUE IOHEXOL 350 MG/ML SOLN COMPARISON:  Radiography same day FINDINGS: Pulmonary arterial opacification is good. There are no visible pulmonary emboli. There is patchy infiltrate and atelectasis in the right lower lobe consistent with pneumonia. Similar but slightly less pronounced findings in the left lower lobe is well. No dense consolidation. No pleural or pericardial fluid. No evidence of aortic pathology. No hilar or mediastinal lymphadenopathy or mass. Slightly prominent axillary lymph nodes bilaterally. Scans in the upper abdomen are unremarkable. Review of the MIP images confirms the above findings. IMPRESSION: No pulmonary emboli. Patchy infiltrate/ atelectasis in the lower lobes consistent with mild pneumonia. Electronically Signed   By: Paulina FusiMark  Shogry M.D.   On: 09/26/2015 10:23   I have personally reviewed and evaluated these images and lab results as part of my medical decision-making.   EKG Interpretation   Date/Time:  Monday September 26 2015 07:02:53 EDT Ventricular Rate:  94 PR Interval:  144 QRS Duration: 69 QT Interval:  354 QTC Calculation: 443 R Axis:   63 Text Interpretation:  Sinus rhythm Borderline T abnormalities, inferior  leads No previous ECGs available Confirmed by Jeniah Kishi ([id number]) on  09/26/2015 7:46:55 AM      MDM  Patient seen and evaluated in stable condition.  No sign of rash on examination.  Initial concern for infectious process vs PE in light of tachycardia, shortness of breath, and implanon.  Chest xray and labs were unremarkable other than elevated d dimer.  CTA negative for PE but consistent with pneumonia.  PAtient non toxic appearing, saturating well with stable vitals.  Results discussed with  patient who expressed understanding and agreement with plan for discharge with course of antibiotics and outpatient follow up.  Patient was discharged home in stable condition. Final diagnoses:  CAP (community acquired pneumonia)    1. Community Acquired Pneumonia    Leta Baptist, MD 09/27/15 2147

## 2015-09-26 NOTE — Discharge Instructions (Signed)

## 2015-12-26 ENCOUNTER — Emergency Department (HOSPITAL_COMMUNITY)
Admission: EM | Admit: 2015-12-26 | Discharge: 2015-12-26 | Disposition: A | Discharge status: Home / Self Care | Payer: Medicaid Other | Attending: Emergency Medicine | Admitting: Emergency Medicine

## 2015-12-26 ENCOUNTER — Encounter (HOSPITAL_COMMUNITY): Payer: Self-pay | Admitting: Emergency Medicine

## 2015-12-26 DIAGNOSIS — Z3202 Encounter for pregnancy test, result negative: Secondary | ICD-10-CM | POA: Insufficient documentation

## 2015-12-26 DIAGNOSIS — B9689 Other specified bacterial agents as the cause of diseases classified elsewhere: Secondary | ICD-10-CM

## 2015-12-26 DIAGNOSIS — R350 Frequency of micturition: Secondary | ICD-10-CM

## 2015-12-26 DIAGNOSIS — N760 Acute vaginitis: Secondary | ICD-10-CM | POA: Insufficient documentation

## 2015-12-26 LAB — URINE MICROSCOPIC-ADD ON

## 2015-12-26 LAB — URINALYSIS, ROUTINE W REFLEX MICROSCOPIC
Bilirubin Urine: NEGATIVE
Glucose, UA: NEGATIVE mg/dL
Ketones, ur: NEGATIVE mg/dL
Nitrite: NEGATIVE
Protein, ur: >300 mg/dL — AB
Specific Gravity, Urine: 1.029 (ref 1.005–1.030)
pH: 7.5 (ref 5.0–8.0)

## 2015-12-26 LAB — WET PREP, GENITAL
Sperm: NONE SEEN
Trich, Wet Prep: NONE SEEN
Yeast Wet Prep HPF POC: NONE SEEN

## 2015-12-26 LAB — POC URINE PREG, ED: Preg Test, Ur: NEGATIVE

## 2015-12-26 MED ORDER — NITROFURANTOIN MONOHYD MACRO 100 MG PO CAPS: 100.0000 mg | ORAL_CAPSULE | Freq: Two times a day (BID) | ORAL | Status: DC

## 2015-12-26 MED ORDER — METRONIDAZOLE 500 MG PO TABS: 500.0000 mg | ORAL_TABLET | Freq: Two times a day (BID) | ORAL | Status: DC

## 2015-12-26 NOTE — ED Provider Notes (Signed)
CSN: [id number]     Arrival date & time 12/26/15  1921 History   First MD Initiated Contact with Patient 12/26/15 2030     Chief Complaint  Patient presents with  . Urinary Frequency  . Vaginal Discharge     (Consider location/radiation/quality/duration/timing/severity/associated sxs/prior Treatment) HPI Comments: 25 year old female who presents with urinary frequency and vaginal discharge. 2 days ago, the patient began having increased urination. She denies any associated dysuria, hematuria, or abdominal pain. No fevers, vomiting, or recent illness. She notes that earlier today she began having clear vaginal discharge. She denies any vaginal burning or pain. She is sexually active with one partner and intermittently uses protection.  Patient is a 25 y.o. female presenting with frequency and vaginal discharge. The history is provided by the patient.  Urinary Frequency  Vaginal Discharge   History reviewed. No pertinent past medical history. History reviewed. No pertinent past surgical history. No family history on file. Social History  Substance Use Topics  . Smoking status: Never Smoker   . Smokeless tobacco: None  . Alcohol Use: No   OB History    No data available     Review of Systems  Genitourinary: Positive for frequency and vaginal discharge.    10 Systems reviewed and are negative for acute change except as noted in the HPI.   Allergies  Review of patient's allergies indicates no known allergies.  Home Medications   Prior to Admission medications   Medication Sig Start Date End Date Taking? Authorizing Provider  diphenhydrAMINE (BENADRYL) 25 MG tablet Take 50 mg by mouth every 6 (six) hours as needed for itching or allergies.    Yes Historical Provider, MD  etonogestrel (IMPLANON) 68 MG IMPL implant 1 each by Subdermal route once. 04/2014   Yes Historical Provider, MD  metroNIDAZOLE (FLAGYL) 500 MG tablet Take 1 tablet (500 mg total) by mouth 2 (two) times  daily. 12/26/15   Laurence Spates, MD  nitrofurantoin, macrocrystal-monohydrate, (MACROBID) 100 MG capsule Take 1 capsule (100 mg total) by mouth 2 (two) times daily. 12/26/15   Ambrose Finland Little, MD   BP 140/92 mmHg  Pulse 79  Temp(Src) 97.6 F (36.4 C) (Oral)  Resp 18  Ht  (1.575 m)  Wt 155 lb (70.308 kg)  BMI 28.34 kg/m2  SpO2 100% Physical Exam  Constitutional: She is oriented to person, place, and time. She appears well-developed and well-nourished. No distress.  HENT:  Head: Normocephalic and atraumatic.  Moist mucous membranes  Eyes: Conjunctivae are normal. Pupils are equal, round, and reactive to light.  Neck: Neck supple.  Cardiovascular: Normal rate, regular rhythm and normal heart sounds.   No murmur heard. Pulmonary/Chest: Effort normal and breath sounds normal.  Abdominal: Soft. Bowel sounds are normal. She exhibits no distension. There is no tenderness.  Genitourinary: Vaginal discharge found.  Small amount white vaginal discharge in vault; no cervical motion or adnexal tenderness  Musculoskeletal: She exhibits no edema.  Neurological: She is alert and oriented to person, place, and time.  Fluent speech  Skin: Skin is warm and dry. No rash noted.  Psychiatric: She has a normal mood and affect. Judgment normal.  Nursing note and vitals reviewed.  Chaperone was present during exam.  ED Course  Procedures (including critical care time) Labs Review Labs Reviewed  WET PREP, GENITAL - Abnormal; Notable for the following:    Clue Cells Wet Prep HPF POC PRESENT (*)    WBC, Wet Prep HPF POC MANY (*)  All other components within normal limits  URINALYSIS, ROUTINE W REFLEX MICROSCOPIC (NOT AT [facility]) - Abnormal; Notable for the following:    APPearance CLOUDY (*)    Hgb urine dipstick SMALL (*)    Protein, ur >300 (*)    Leukocytes, UA TRACE (*)    All other components within normal limits  URINE MICROSCOPIC-ADD ON - Abnormal; Notable for the following:     Squamous Epithelial / LPF 0-5 (*)    Bacteria, UA RARE (*)    All other components within normal limits  POC URINE PREG, ED  GC/CHLAMYDIA PROBE AMP (Central) NOT AT [facility]    Imaging Review No results found. I have personally reviewed and evaluated these lab results as part of my medical decision-making.   EKG Interpretation None      MDM   Final diagnoses:  Bacterial vaginosis  Urinary frequency   Pt resents with urinary frequency and vaginal discharge. No complaints of pain. She was well-appearing with normal vital signs. No abdominal tenderness. Small amount of white vaginal discharge in vaginal vault, no cervical motion or adnexal tenderness to suggest PID. UA shows trace leukocytes and rare bacteria, not impressive for infection. Wet prep w/ clue cells. Provided with Flagyl to treat bacterial vaginosis and instructed on supportive care including no douching, soaps, or soaking in bubble bath. Regarding urinary frequency, I provided patient with prescription for Macrobid and instructed to use only if urinary symptoms worsen or persist after the next 2-3 days. Patient voiced understanding and was discharged in satisfactory condition.   Laurence Spates, MD 12/26/15 2156

## 2015-12-26 NOTE — Discharge Instructions (Signed)
Flagyl- take for bacterial vaginosis macrobid- use as needed for continued urinary frequency or burning  Bacterial Vaginosis Bacterial vaginosis is a vaginal infection that occurs when the normal balance of bacteria in the vagina is disrupted. It results from an overgrowth of certain bacteria. This is the most common vaginal infection in women of childbearing age. Treatment is important to prevent complications, especially in pregnant women, as it can cause a premature delivery. CAUSES  Bacterial vaginosis is caused by an increase in harmful bacteria that are normally present in smaller amounts in the vagina. Several different kinds of bacteria can cause bacterial vaginosis. However, the reason that the condition develops is not fully understood. RISK FACTORS Certain activities or behaviors can put you at an increased risk of developing bacterial vaginosis, including:  Having a new sex partner or multiple sex partners.  Douching.  Using an intrauterine device (IUD) for contraception. Women do not get bacterial vaginosis from toilet seats, bedding, swimming pools, or contact with objects around them. SIGNS AND SYMPTOMS  Some women with bacterial vaginosis have no signs or symptoms. Common symptoms include:  Grey vaginal discharge.  A fishlike odor with discharge, especially after sexual intercourse.  Itching or burning of the vagina and vulva.  Burning or pain with urination. DIAGNOSIS  Your health care provider will take a medical history and examine the vagina for signs of bacterial vaginosis. A sample of vaginal fluid may be taken. Your health care provider will look at this sample under a microscope to check for bacteria and abnormal cells. A vaginal pH test may also be done.  TREATMENT  Bacterial vaginosis may be treated with antibiotic medicines. These may be given in the form of a pill or a vaginal cream. A second round of antibiotics may be prescribed if the condition comes back  after treatment. Because bacterial vaginosis increases your risk for sexually transmitted diseases, getting treated can help reduce your risk for chlamydia, gonorrhea, HIV, and herpes. HOME CARE INSTRUCTIONS   Only take over-the-counter or prescription medicines as directed by your health care provider.  If antibiotic medicine was prescribed, take it as directed. Make sure you finish it even if you start to feel better.  Tell all sexual partners that you have a vaginal infection. They should see their health care provider and be treated if they have problems, such as a mild rash or itching.  During treatment, it is important that you follow these instructions:  Avoid sexual activity or use condoms correctly.  Do not douche.  Avoid alcohol as directed by your health care provider.  Avoid breastfeeding as directed by your health care provider. SEEK MEDICAL CARE IF:   Your symptoms are not improving after 3 days of treatment.  You have increased discharge or pain.  You have a fever. MAKE SURE YOU:   Understand these instructions.  Will watch your condition.  Will get help right away if you are not doing well or get worse. FOR MORE INFORMATION  Centers for Disease Control and Prevention, Division of STD Prevention: [employer] American Sexual Health Association (ASHA): www.ashastd.org    This information is not intended to replace advice given to you by your health care provider. Make sure you discuss any questions you have with your health care provider.   Document Released: 11/12/2005 Document Revised: 12/03/2014 Document Reviewed: 06/24/2013 Elsevier Interactive Patient Education [employer]

## 2015-12-26 NOTE — ED Notes (Signed)
Patient presents with urinary frequency x2 days and clear vaginal discharge x1 day. Denies dysuria, denies hematuria, denies pain.

## 2015-12-26 NOTE — Progress Notes (Signed)
EDCM spoke to patient at bedside.  Patient reports her pcp is Dr. Georgann Housekeeper of [facility].  She reports she is still covered under her father's insurance.  No further EDCM needs at this time.

## 2015-12-26 NOTE — ED Notes (Signed)
MD at bedside. 

## 2015-12-27 LAB — GC/CHLAMYDIA PROBE AMP (~~LOC~~) NOT AT ARMC
Chlamydia: NEGATIVE
Neisseria Gonorrhea: NEGATIVE

## 2016-03-11 ENCOUNTER — Encounter (HOSPITAL_COMMUNITY): Payer: Self-pay | Admitting: *Deleted

## 2016-03-11 ENCOUNTER — Emergency Department (HOSPITAL_COMMUNITY)
Admission: EM | Admit: 2016-03-11 | Discharge: 2016-03-11 | Disposition: A | Discharge status: Home / Self Care | Payer: Medicaid Other | Attending: Emergency Medicine | Admitting: Emergency Medicine

## 2016-03-11 DIAGNOSIS — H9203 Otalgia, bilateral: Secondary | ICD-10-CM | POA: Insufficient documentation

## 2016-03-11 DIAGNOSIS — J029 Acute pharyngitis, unspecified: Secondary | ICD-10-CM | POA: Insufficient documentation

## 2016-03-11 DIAGNOSIS — R05 Cough: Secondary | ICD-10-CM | POA: Insufficient documentation

## 2016-03-11 DIAGNOSIS — Z792 Long term (current) use of antibiotics: Secondary | ICD-10-CM | POA: Insufficient documentation

## 2016-03-11 DIAGNOSIS — R6889 Other general symptoms and signs: Secondary | ICD-10-CM

## 2016-03-11 DIAGNOSIS — J3489 Other specified disorders of nose and nasal sinuses: Secondary | ICD-10-CM | POA: Insufficient documentation

## 2016-03-11 DIAGNOSIS — Z79899 Other long term (current) drug therapy: Secondary | ICD-10-CM | POA: Insufficient documentation

## 2016-03-11 DIAGNOSIS — R6883 Chills (without fever): Secondary | ICD-10-CM | POA: Insufficient documentation

## 2016-03-11 MED ORDER — BENZONATATE 100 MG PO CAPS: 100.0000 mg | ORAL_CAPSULE | Freq: Three times a day (TID) | ORAL | Status: DC

## 2016-03-11 NOTE — ED Provider Notes (Signed)
CSN: [id number]     Arrival date & time 03/11/16  1423 History  By signing my name below, I, Vanessa Collier, attest that this documentation has been prepared under the direction and in the presence of Lane Hacker, PA-C. Electronically Signed: Tanda Collier, ED Scribe. 03/11/2016. 4:13 PM.   Chief Complaint  Patient presents with  . Sore Throat  . Generalized Body Aches   The history is provided by the patient. No language interpreter was used.     HPI Comments: Vanessa Collier is a 25 y.o. female who presents to the Emergency Department complaining of gradual onset, constant, sore throat x 4 days. Pt also complains of  rhinorrhea, congestion, body aches, productive cough, bilateral ear pain, and chills. Denies fever or any other associated symptoms.   History reviewed. No pertinent past medical history. Past Surgical History  Procedure Laterality Date  . Tonsillectomy     No family history on file. Social History  Substance Use Topics  . Smoking status: Never Smoker   . Smokeless tobacco: None  . Alcohol Use: No   OB History    No data available     Review of Systems  A complete 10 system review of systems was obtained and all systems are negative except as noted in the HPI and PMH.   Allergies  Review of patient's allergies indicates no known allergies.  Home Medications   Prior to Admission medications   Medication Sig Start Date End Date Taking? Authorizing Provider  diphenhydrAMINE (BENADRYL) 25 MG tablet Take 50 mg by mouth every 6 (six) hours as needed for itching or allergies.     Historical Provider, MD  etonogestrel (IMPLANON) 68 MG IMPL implant 1 each by Subdermal route once. 04/2014    Historical Provider, MD  metroNIDAZOLE (FLAGYL) 500 MG tablet Take 1 tablet (500 mg total) by mouth 2 (two) times daily. 12/26/15   Laurence Spates, MD  nitrofurantoin, macrocrystal-monohydrate, (MACROBID) 100 MG capsule Take 1 capsule (100 mg total) by mouth 2 (two) times  daily. 12/26/15   Ambrose Finland Little, MD   BP 133/86 mmHg  Pulse 88  Temp(Src) 98.3 F (36.8 C) (Oral)  Resp 16  SpO2 100%   Physical Exam  Constitutional: She is oriented to person, place, and time. She appears well-developed and well-nourished. No distress.  HENT:  Head: Normocephalic and atraumatic.  Right Ear: No drainage. Tympanic membrane is not erythematous and not bulging.  Left Ear: No drainage. Tympanic membrane is not erythematous and not bulging.  Mouth/Throat: Oropharynx is clear and moist. No oropharyngeal exudate, posterior oropharyngeal edema or posterior oropharyngeal erythema.  Eyes: Conjunctivae and EOM are normal.  Neck: Neck supple. No tracheal deviation present.  Cardiovascular: Normal rate, regular rhythm and normal heart sounds.   Pulmonary/Chest: Effort normal and breath sounds normal. No respiratory distress. She has no wheezes. She has no rales. She exhibits no tenderness.  Abdominal: Soft. There is no tenderness.  Musculoskeletal: Normal range of motion.  Neurological: She is alert and oriented to person, place, and time.  Skin: Skin is warm and dry.  Psychiatric: She has a normal mood and affect. Her behavior is normal.  Nursing note and vitals reviewed.   ED Course  Procedures   DIAGNOSTIC STUDIES: Oxygen Saturation is 100% on RA, normal by my interpretation.    COORDINATION OF CARE: 4:10 PM-Discussed treatment plan which includes OTC Tylenol/Ibuprofen and Rx Tessalon Pearls with pt at bedside and pt agreed to plan.   MDM  Final diagnoses:  Influenza-like symptoms    Patients symptoms are consistent with URI, likely viral etiology. Discussed that antibiotics are not indicated for viral infections. Pt will be discharged with symptomatic treatment.  Verbalizes understanding and is agreeable with plan. Pt is hemodynamically stable & in NAD prior to dc.  I personally performed the services described in this documentation, which was scribed in my  presence. The recorded information has been reviewed and is accurate.    Melton KrebsSamantha Nicole Ignacio Lowder, PA-C 03/16/16 [id number]  Derwood KaplanAnkit Nanavati, MD 03/17/16 [phone]

## 2016-03-11 NOTE — Discharge Instructions (Signed)
Ms. Vanessa Collier,  Nice meeting you! Please follow-up with your primary care provider. Return to the emergency department if you develop chest pain, shortness of breath, inability to swallow, drooling, new/worsening symptoms. You can take tylenol and ibuprofen for your body aches. Feel better soon!  S. Lane HackerNicole Yogi Arther, PA-C

## 2016-03-11 NOTE — ED Notes (Signed)
Pt reports body aches, sore throat and low back pain since Thursday.  Pt reports nasal congestion and coughing up "mucus."

## 2016-03-11 NOTE — Progress Notes (Signed)
Pt was encouraged to drink plenty of fluid and take rest and take it easy.

## 2016-07-08 IMAGING — US US PELVIS COMPLETE
1 series · 13 of 25 positions shown · non-contrast
Comparison: None

CLINICAL DATA: Pelvic pain for 2 days. Irregular menstrual
bleeding. Pelvic inflammatory disease.

EXAM:
TRANSABDOMINAL AND TRANSVAGINAL ULTRASOUND OF PELVIS
TECHNIQUE: Both transabdominal and transvaginal ultrasound examinations of the
pelvis were performed. Transabdominal technique was performed for
global imaging of the pelvis including uterus, ovaries, adnexal
regions, and pelvic cul-de-sac. It was necessary to proceed with
endovaginal exam following the transabdominal exam to visualize the
uterus, ovaries, and adnexa.

[Series 1: us pelvis complete · 0.24mm/px · 99 acquisitions, 13 frames shown]
[im 1/99]
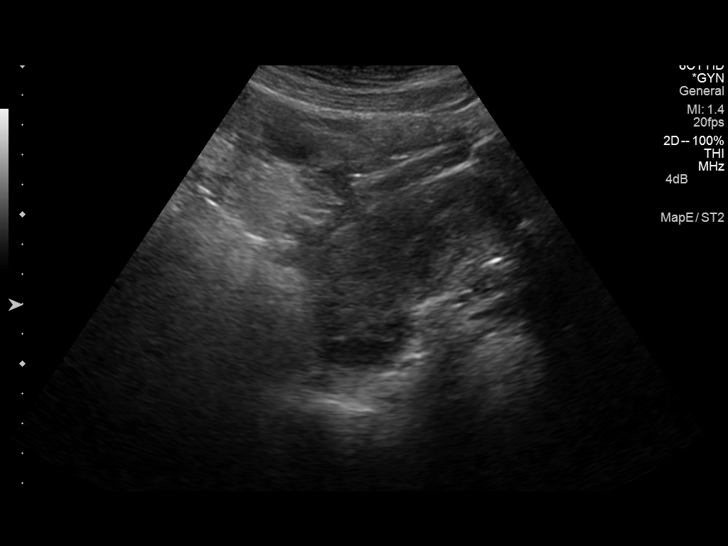
[im 9/99]
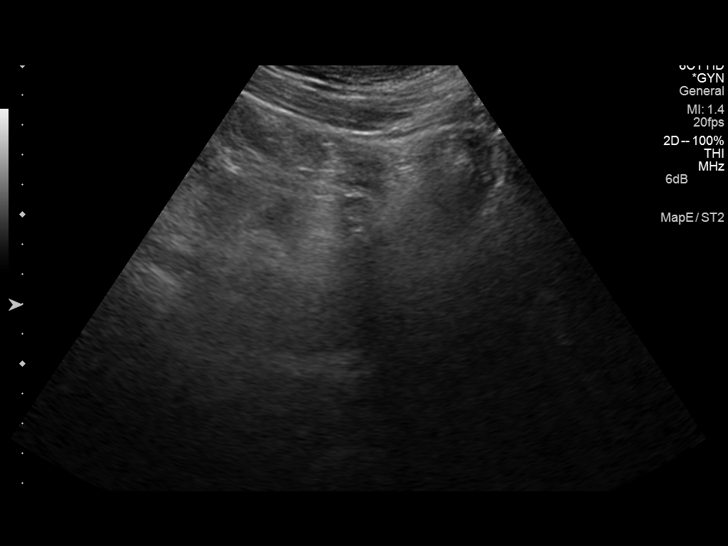
[im 17/99]
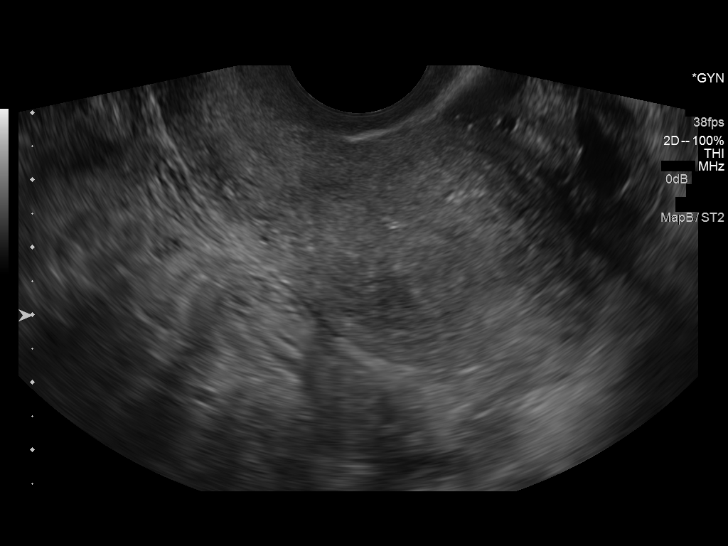
[im 25/99]
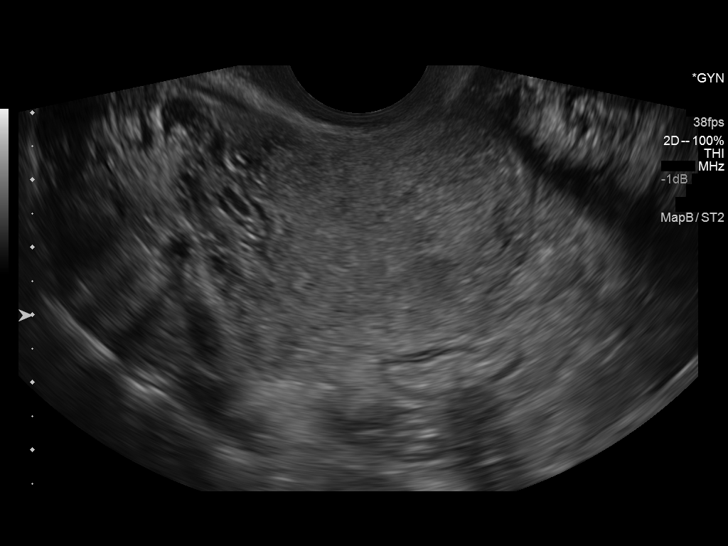
[im 33/99]
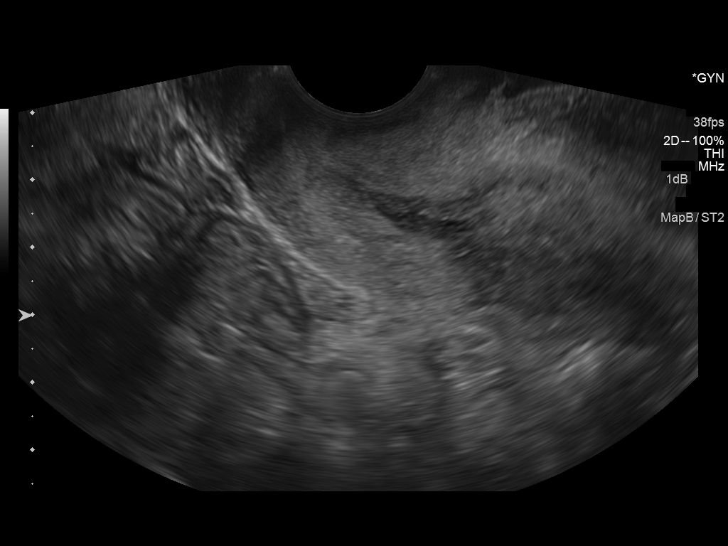
[im 41/99]
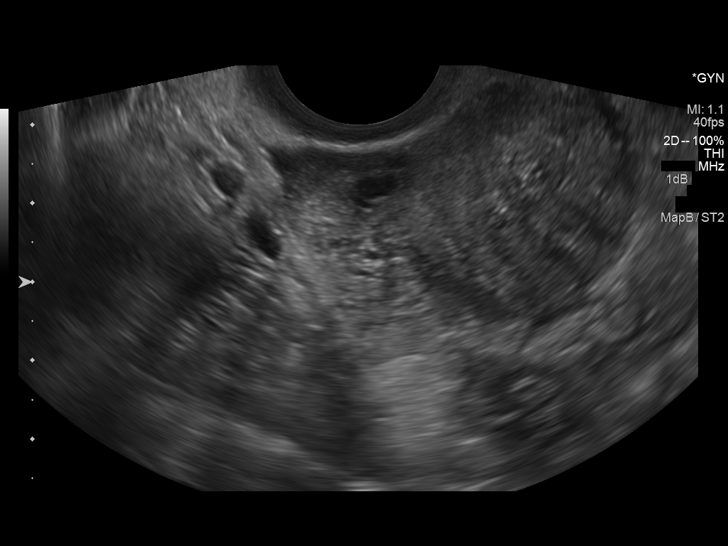
[im 50/99]
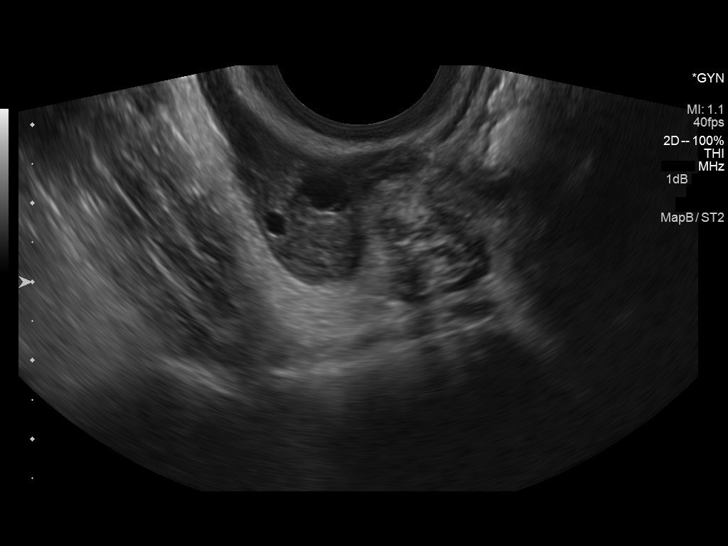
[im 58/99]
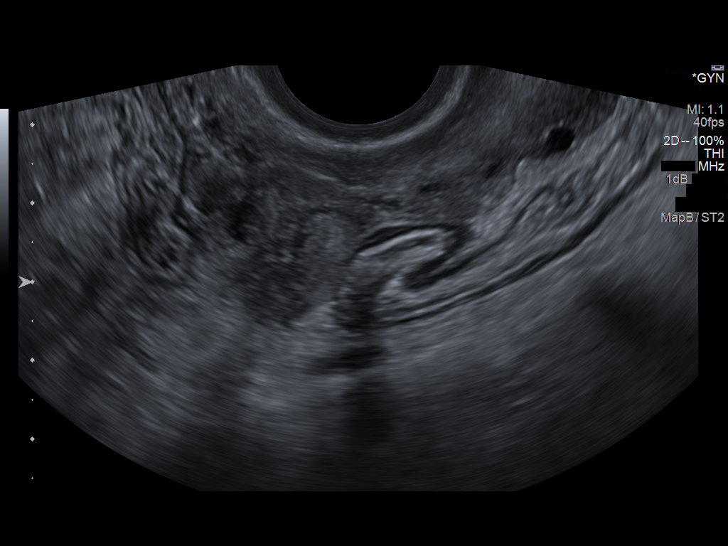
[im 66/99]
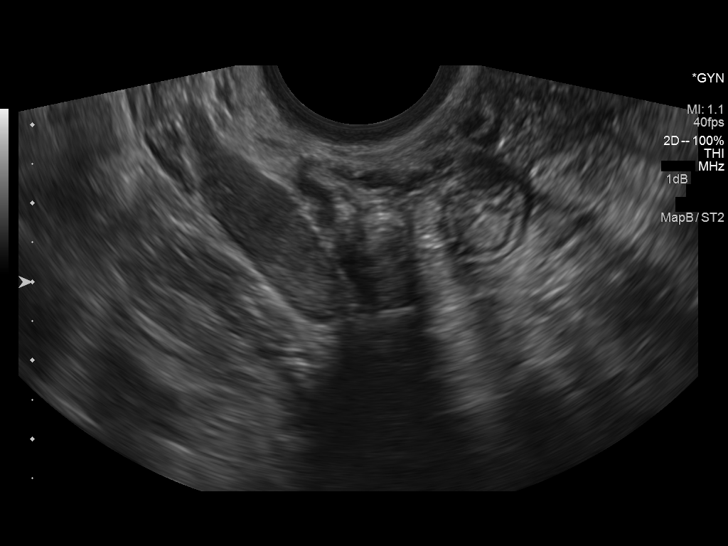
[im 74/99]
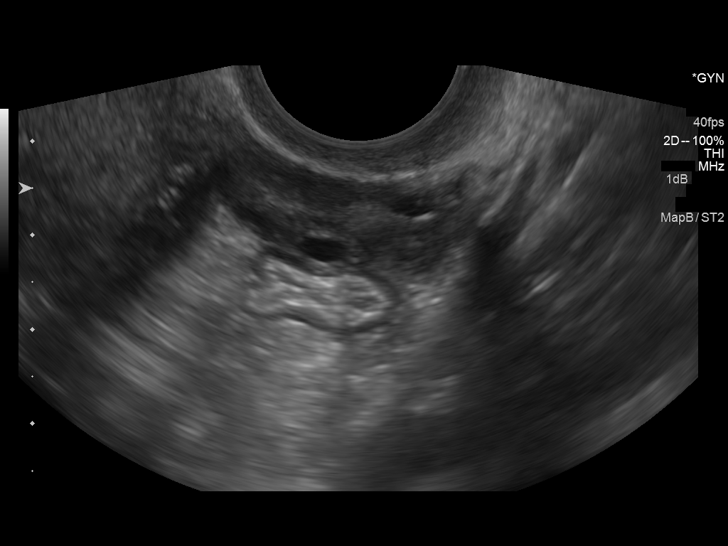
[im 82/99]
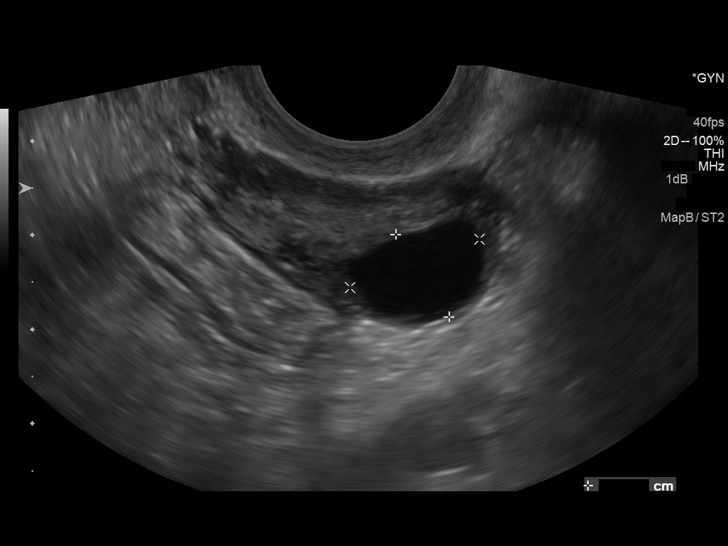
[im 90/99]
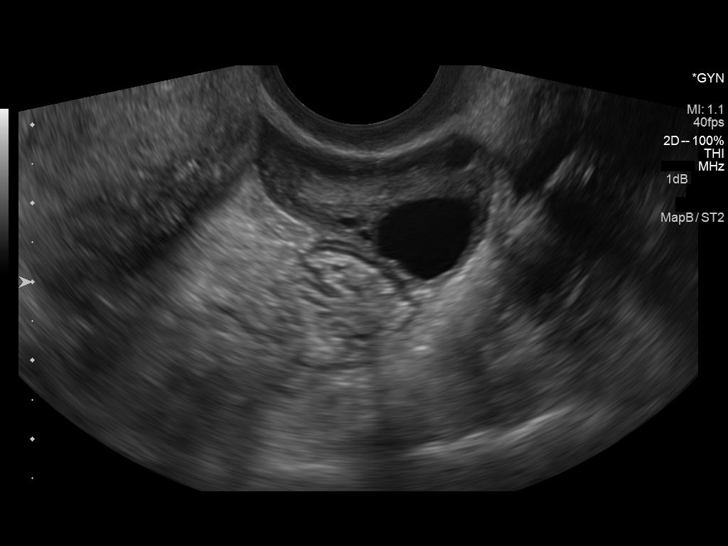
[im 99/99]
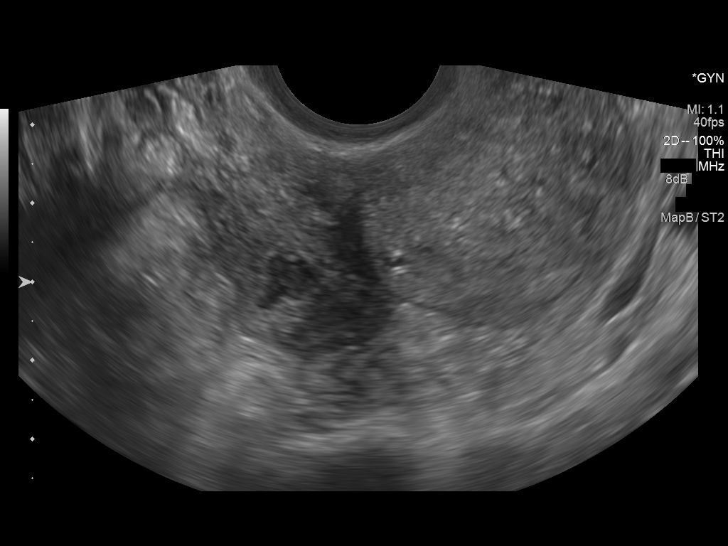

[13 of 25 positions shown; findings below may reference images not displayed]

FINDINGS: Uterus

Measurements: Retroverted measuring 7.8 x 3.8 x 4.0 cm. No fibroids
or other mass visualized. The uterus is best assessed
transvaginally.

Endometrium

Thickness: 3 mm.  No focal abnormality visualized.

Right ovary

Measurements: 4.0 x 2.3 x 2.8 cm. There is a follicular 1.4 cm cyst.
Normal blood flow seen. Adjacent to the right ovary in the right
adnexa is 1.7 x 1.6 x 2.0 cm complex regional with peripheral blood
flow and small central hypoechoic region as well as an adjacent
tubular echogenic structure.

Left ovary

Measurements: 3.0 x 1.4 3.1 cm. There is a 1.5 cm follicular cyst.
Normal blood flow. No adnexal mass.

Other findings

Small amount of simple free fluid.
IMPRESSION: 1. Complex right adnexal process with echogenic tubular structure
and complex region adjacent to the right ovary that may reflect a
small 2 cm abscess, given the clinical concern for pelvic
inflammatory disease. Small amount of simple free fluid in the
pelvis.
2. There is normal blood flow to both ovaries, no torsion.
Retroverted but normal appearance of the uterus.

## 2016-10-24 ENCOUNTER — Other Ambulatory Visit: Payer: Self-pay | Admitting: Internal Medicine

## 2016-10-24 DIAGNOSIS — R5383 Other fatigue: Secondary | ICD-10-CM

## 2016-10-30 ENCOUNTER — Other Ambulatory Visit: Payer: Medicaid Other

## 2016-10-30 ENCOUNTER — Ambulatory Visit
Admission: RE | Admit: 2016-10-30 | Discharge: 2016-10-30 | Disposition: A | Discharge status: Home / Self Care | Payer: BLUE CROSS/BLUE SHIELD | Source: Ambulatory Visit | Attending: Internal Medicine | Admitting: Internal Medicine

## 2016-10-30 DIAGNOSIS — R5383 Other fatigue: Secondary | ICD-10-CM

## 2017-01-30 ENCOUNTER — Ambulatory Visit
Admission: RE | Admit: 2017-01-30 | Discharge: 2017-01-30 | Disposition: A | Discharge status: Home / Self Care | Payer: BLUE CROSS/BLUE SHIELD | Source: Ambulatory Visit | Attending: Internal Medicine | Admitting: Internal Medicine

## 2017-01-30 ENCOUNTER — Other Ambulatory Visit: Payer: Self-pay | Admitting: Internal Medicine

## 2017-01-30 DIAGNOSIS — M25569 Pain in unspecified knee: Secondary | ICD-10-CM

## 2017-05-20 ENCOUNTER — Other Ambulatory Visit (HOSPITAL_COMMUNITY): Payer: Self-pay | Admitting: Nephrology

## 2017-05-20 DIAGNOSIS — R808 Other proteinuria: Secondary | ICD-10-CM

## 2017-05-23 ENCOUNTER — Ambulatory Visit (HOSPITAL_COMMUNITY): Payer: BLUE CROSS/BLUE SHIELD

## 2017-06-03 ENCOUNTER — Ambulatory Visit (HOSPITAL_COMMUNITY): Payer: BLUE CROSS/BLUE SHIELD

## 2017-06-03 ENCOUNTER — Other Ambulatory Visit: Payer: Self-pay | Admitting: Radiology

## 2017-06-04 ENCOUNTER — Encounter (HOSPITAL_COMMUNITY): Payer: Self-pay

## 2017-06-04 ENCOUNTER — Ambulatory Visit (HOSPITAL_COMMUNITY)
Admission: RE | Admit: 2017-06-04 | Discharge: 2017-06-04 | Disposition: A | Discharge status: Home / Self Care | Payer: BLUE CROSS/BLUE SHIELD | Source: Ambulatory Visit | Attending: Nephrology | Admitting: Nephrology

## 2017-06-04 DIAGNOSIS — R808 Other proteinuria: Secondary | ICD-10-CM | POA: Diagnosis present

## 2017-06-04 DIAGNOSIS — N189 Chronic kidney disease, unspecified: Secondary | ICD-10-CM | POA: Insufficient documentation

## 2017-06-04 DIAGNOSIS — Z79899 Other long term (current) drug therapy: Secondary | ICD-10-CM | POA: Diagnosis not present

## 2017-06-04 DIAGNOSIS — N052 Unspecified nephritic syndrome with diffuse membranous glomerulonephritis: Secondary | ICD-10-CM | POA: Insufficient documentation

## 2017-06-04 DIAGNOSIS — Z9889 Other specified postprocedural states: Secondary | ICD-10-CM | POA: Diagnosis not present

## 2017-06-04 DIAGNOSIS — Z808 Family history of malignant neoplasm of other organs or systems: Secondary | ICD-10-CM | POA: Diagnosis not present

## 2017-06-04 HISTORY — DX: Chronic kidney disease, unspecified: N18.9

## 2017-06-04 LAB — CBC
HCT: 35.3 % — ABNORMAL LOW (ref 36.0–46.0)
Hemoglobin: 11.5 g/dL — ABNORMAL LOW (ref 12.0–15.0)
MCH: 27.5 pg (ref 26.0–34.0)
MCHC: 32.6 g/dL (ref 30.0–36.0)
MCV: 84.4 fL (ref 78.0–100.0)
Platelets: 263 10*3/uL (ref 150–400)
RBC: 4.18 MIL/uL (ref 3.87–5.11)
RDW: 13.7 % (ref 11.5–15.5)
WBC: 7.2 10*3/uL (ref 4.0–10.5)

## 2017-06-04 LAB — APTT: aPTT: 24 seconds (ref 24–36)

## 2017-06-04 LAB — PROTIME-INR
INR: 0.99
Prothrombin Time: 13.1 seconds (ref 11.4–15.2)

## 2017-06-04 LAB — PREGNANCY, URINE: Preg Test, Ur: NEGATIVE

## 2017-06-04 MED ORDER — ACETAMINOPHEN 325 MG PO TABS
ORAL_TABLET | ORAL | Status: AC
  Filled 2017-06-04: qty 2

## 2017-06-04 MED ORDER — HYDROCODONE-ACETAMINOPHEN 5-325 MG PO TABS
ORAL_TABLET | ORAL | Status: AC
  Filled 2017-06-04: qty 1

## 2017-06-04 MED ORDER — HYDROCODONE-ACETAMINOPHEN 5-325 MG PO TABS
1.0000 | ORAL_TABLET | Freq: Once | ORAL | Status: AC
  Administered 2017-06-04: 1 via ORAL

## 2017-06-04 MED ORDER — LIDOCAINE HCL (PF) 1 % IJ SOLN
INTRAMUSCULAR | Status: AC
  Filled 2017-06-04: qty 30

## 2017-06-04 MED ORDER — HYDRALAZINE HCL 20 MG/ML IJ SOLN
INTRAMUSCULAR | Status: AC
  Filled 2017-06-04: qty 1

## 2017-06-04 MED ORDER — HYDRALAZINE HCL 20 MG/ML IJ SOLN
10.0000 mg | Freq: Once | INTRAMUSCULAR | Status: AC
  Administered 2017-06-04: 10 mg via INTRAVENOUS

## 2017-06-04 MED ORDER — MIDAZOLAM HCL 2 MG/2ML IJ SOLN
INTRAMUSCULAR | Status: DC | PRN
  Administered 2017-06-04: 1 mg via INTRAVENOUS

## 2017-06-04 MED ORDER — FENTANYL CITRATE (PF) 100 MCG/2ML IJ SOLN
INTRAMUSCULAR | Status: AC
  Filled 2017-06-04: qty 4

## 2017-06-04 MED ORDER — FENTANYL CITRATE (PF) 100 MCG/2ML IJ SOLN
INTRAMUSCULAR | Status: DC | PRN
  Administered 2017-06-04 (×2): 50 ug via INTRAVENOUS

## 2017-06-04 MED ORDER — ACETAMINOPHEN 325 MG PO TABS
650.0000 mg | ORAL_TABLET | Freq: Once | ORAL | Status: AC
Start: 2017-06-04 — End: 2017-06-04
  Administered 2017-06-04: 650 mg via ORAL
  Filled 2017-06-04: qty 2

## 2017-06-04 MED ORDER — SODIUM CHLORIDE 0.9 % IV SOLN: INTRAVENOUS | Status: DC

## 2017-06-04 MED ORDER — SODIUM CHLORIDE 0.9 % IV SOLN
INTRAVENOUS | Status: DC | PRN
  Administered 2017-06-04: 75 mL/h via INTRAVENOUS

## 2017-06-04 MED ORDER — MIDAZOLAM HCL 2 MG/2ML IJ SOLN
INTRAMUSCULAR | Status: AC
  Filled 2017-06-04: qty 4

## 2017-06-04 MED ORDER — LISINOPRIL 10 MG PO TABS
10.0000 mg | ORAL_TABLET | Freq: Once | ORAL | Status: AC
  Administered 2017-06-04: 10 mg via ORAL
  Filled 2017-06-04: qty 1

## 2017-06-04 NOTE — Discharge Instructions (Addendum)

## 2017-06-04 NOTE — Sedation Documentation (Signed)
Patient is resting comfortably. 

## 2017-06-04 NOTE — Sedation Documentation (Signed)
O2 d/c'd 

## 2017-06-04 NOTE — H&P (Signed)
Chief Complaint: Patient was seen in consultation today for random renal biopsy at the request of Goldsborough,Kellie  Referring Physician(s): Annie Sable  Supervising Physician: Irish Lack  Patient Status: [name] - Out-pt  History of Present Illness: Vanessa Collier is a 26 y.o. female   Noted edema in legs March 2018 +proteinuria Has been followed by Dr Burney Gauze Referred to Dr Kathrene Bongo and was evaluated 2 weeks ago Now request for random renal biopsy    Past Medical History:  Diagnosis Date  . Chronic kidney disease     Past Surgical History:  Procedure Laterality Date  . TONSILLECTOMY      Allergies: Patient has no known allergies.  Medications: Prior to Admission medications   Medication Sig Start Date End Date Taking? Authorizing Provider  furosemide (LASIX) 20 MG tablet Take 20 mg by mouth daily.   Yes [provider]  levothyroxine (SYNTHROID, LEVOTHROID) 125 MCG tablet Take 125 mcg by mouth daily before breakfast.   Yes [provider]  lisinopril (PRINIVIL,ZESTRIL) 10 MG tablet Take 10 mg by mouth daily.   Yes [provider]     Family History  Problem Relation Age of Onset  . Uterine cancer Mother     Social History   Social History  . Marital status: Single    Spouse name: N/A  . Number of children: N/A  . Years of education: N/A   Social History Main Topics  . Smoking status: Never Smoker  . Smokeless tobacco: Never Used  . Alcohol use No  . Drug use: No  . Sexual activity: Not Asked   Other Topics Concern  . None   Social History Narrative  . None    Review of Systems: A 12 point ROS discussed and pertinent positives are indicated in the HPI above.  All other systems are negative.  Review of Systems  Constitutional: Positive for fatigue. Negative for activity change, appetite change and fever.  Respiratory: Negative for shortness of breath.   Cardiovascular: Positive for leg  swelling. Negative for chest pain.  Gastrointestinal: Negative for abdominal pain.  Musculoskeletal: Negative for back pain.  Psychiatric/Behavioral: Negative for confusion and decreased concentration.    Vital Signs: BP (!) 159/105   Pulse 74   Temp 98.3 F (36.8 C) (Oral)   Resp 16   Ht 5\' 1"  (1.549 m)   Wt 160 lb (72.6 kg)   LMP 05/28/2017   SpO2 100%   BMI 30.23 kg/m   Physical Exam  Constitutional: She is oriented to person, place, and time. She appears well-nourished.  HENT:  Head: Atraumatic.  Cardiovascular: Normal rate, regular rhythm and normal heart sounds.   Pulmonary/Chest: Effort normal and breath sounds normal.  Abdominal: Soft. Bowel sounds are normal.  Musculoskeletal: Normal range of motion.  Neurological: She is alert and oriented to person, place, and time.  Skin: Skin is warm and dry.  Psychiatric: She has a normal mood and affect. Her behavior is normal. Judgment and thought content normal.  Nursing note and vitals reviewed.   Mallampati Score:  MD Evaluation Airway: WNL Heart: WNL Abdomen: WNL Chest/ Lungs: WNL ASA  Classification: 2 Mallampati/Airway Score: One  Imaging: No results found.  Labs:  CBC:  Recent Labs  06/04/17 0610  WBC 7.2  HGB 11.5*  HCT 35.3*  PLT 263    COAGS:  Recent Labs  06/04/17 0610  INR 0.99  APTT 24    BMP: No results for input(s): NA, K, CL, CO2,  GLUCOSE, BUN, CALCIUM, CREATININE, GFRNONAA, GFRAA in the last 8760 hours.  Invalid input(s): CMP  LIVER FUNCTION TESTS: No results for input(s): BILITOT, AST, ALT, ALKPHOS, PROT, ALBUMIN in the last 8760 hours.  TUMOR MARKERS: No results for input(s): AFPTM, CEA, CA199, CHROMGRNA in the last 8760 hours.  Assessment and Plan:  Leg edema Proteinuria Chronic kidney disease Scheduled for random renal biopsy Risks and Benefits discussed with the patient including, but not limited to bleeding, infection, damage to adjacent structures or low yield  requiring additional tests. All of the patient's questions were answered, patient is agreeable to proceed. Consent signed and in chart.   Thank you for this interesting consult.  I greatly enjoyed meeting Delrae SawyersShelly D Martell and look forward to participating in their care.  A copy of this report was sent to the requesting provider on this date.  Electronically Signed: Robet LeuURPIN,Elmarie Devlin A, PA-C 06/04/2017, 7:12 AM   I spent a total of  30 Minutes   in face to face in clinical consultation, greater than 50% of which was counseling/coordinating care for random renal biopsy

## 2017-06-04 NOTE — Progress Notes (Signed)
Paged Dr Kathrene BongoGoldsborough regarding no [facility] order form for bx.

## 2017-06-04 NOTE — Procedures (Signed)
Interventional Radiology Procedure Note  Procedure: US guided renal biopsy  Complications: None  Estimated Blood Loss: < 10 mL  16 G core biopsy performed of left LP renal cortex.  No immediate complication.  Jodi MarbleGlenn T. Fredia SorrowYamagata, M.D Pager:  [phone]

## 2017-06-04 NOTE — Progress Notes (Signed)
I woke patient to begin discharge process at 1245.  Patient stated that her head still hurt.  I asked if she would like me to call PA again to see if there was another option.  Patient stated that she would just rather go home and take her prescription medication for migraines.  She gets migraines frequently.  Patient and mother were given discharge instructions and IV d/c'd.  Will continue to monitor patient until discharge out via wheelchair.

## 2017-06-04 NOTE — Sedation Documentation (Signed)
Dr Yamagata at bedside. 

## 2017-06-04 NOTE — Progress Notes (Signed)
No answer from Dr Kathrene BongoGoldsborough. Paged Dr Briant CedarMattingly, md on call, regarding no [facility] order form for renal biopsy.

## 2017-06-04 NOTE — Sedation Documentation (Addendum)
Moved to US, o2 2l/Bradley applied

## 2017-06-06 ENCOUNTER — Ambulatory Visit (HOSPITAL_COMMUNITY): Payer: BLUE CROSS/BLUE SHIELD

## 2017-06-11 ENCOUNTER — Encounter (HOSPITAL_COMMUNITY): Payer: Self-pay

## 2017-08-19 ENCOUNTER — Encounter (HOSPITAL_COMMUNITY): Payer: Self-pay

## 2017-12-21 ENCOUNTER — Encounter (HOSPITAL_COMMUNITY): Payer: Self-pay

## 2017-12-21 ENCOUNTER — Other Ambulatory Visit: Payer: Self-pay

## 2017-12-21 DIAGNOSIS — N189 Chronic kidney disease, unspecified: Secondary | ICD-10-CM | POA: Diagnosis not present

## 2017-12-21 DIAGNOSIS — I129 Hypertensive chronic kidney disease with stage 1 through stage 4 chronic kidney disease, or unspecified chronic kidney disease: Secondary | ICD-10-CM | POA: Diagnosis not present

## 2017-12-21 DIAGNOSIS — F419 Anxiety disorder, unspecified: Secondary | ICD-10-CM | POA: Insufficient documentation

## 2017-12-21 NOTE — ED Triage Notes (Addendum)
States since yesterday felt like blood pressure is elevated and felt like her heart is racing at times states not under any stress. States having a non productive cough and congestion voiced.

## 2017-12-22 ENCOUNTER — Emergency Department (HOSPITAL_COMMUNITY)
Admission: EM | Admit: 2017-12-22 | Discharge: 2017-12-22 | Disposition: A | Discharge status: Home / Self Care | Payer: BLUE CROSS/BLUE SHIELD | Attending: Emergency Medicine | Admitting: Emergency Medicine

## 2017-12-22 DIAGNOSIS — F419 Anxiety disorder, unspecified: Secondary | ICD-10-CM

## 2017-12-22 DIAGNOSIS — I10 Essential (primary) hypertension: Secondary | ICD-10-CM

## 2017-12-22 HISTORY — DX: Essential (primary) hypertension: I10

## 2017-12-22 LAB — BASIC METABOLIC PANEL
Anion gap: 3 — ABNORMAL LOW (ref 5–15)
BUN: 10 mg/dL (ref 6–20)
CO2: 25 mmol/L (ref 22–32)
Calcium: 8 mg/dL — ABNORMAL LOW (ref 8.9–10.3)
Chloride: 110 mmol/L (ref 101–111)
Creatinine, Ser: 0.61 mg/dL (ref 0.44–1.00)
GFR calc Af Amer: >60 mL/min (ref >60)
GFR calc non Af Amer: >60 mL/min (ref >60)
Glucose, Bld: 98 mg/dL (ref 65–99)
Potassium: 3.2 mmol/L — ABNORMAL LOW (ref 3.5–5.1)
Sodium: 138 mmol/L (ref 135–145)

## 2017-12-22 MED ORDER — LORAZEPAM 1 MG PO TABS
1.0000 mg | ORAL_TABLET | Freq: Once | ORAL | Status: AC
  Administered 2017-12-22: 1 mg via ORAL
  Filled 2017-12-22: qty 1

## 2017-12-22 NOTE — ED Provider Notes (Signed)
[facility]-EMERGENCY DEPT Provider Note   CSN: [id number] Arrival date & time: 12/21/17  2249     History   Chief Complaint Chief Complaint  Patient presents with  . Hypertension    HPI Vanessa Collier is a 27 y.o. female.  Patient presents to the emergency department for evaluation of elevated blood pressure.  Patient reports that she noticed that she was not feeling well tonight.  She has been feeling very anxious and has felt like her heart is racing.  She took her blood pressure and it was elevated.  She takes Prinivil 20 mg daily.  She has not missed any doses.  She is not having headache or blurred vision.  She does have approximately 5-day history of cough and nasal congestion.  Her doctor gave her a cough syrup, she has not been taking any decongestants.      Past Medical History:  Diagnosis Date  . Chronic kidney disease   . Hypertension     There are no active problems to display for this patient.   Past Surgical History:  Procedure Laterality Date  . TONSILLECTOMY      OB History    No data available       Home Medications    Prior to Admission medications   Medication Sig Start Date End Date Taking? Authorizing Provider  furosemide (LASIX) 20 MG tablet Take 20 mg by mouth daily.    [provider]  levothyroxine (SYNTHROID, LEVOTHROID) 125 MCG tablet Take 125 mcg by mouth daily before breakfast.    [provider]  lisinopril (PRINIVIL,ZESTRIL) 10 MG tablet Take 10 mg by mouth daily.    [provider]    Family History Family History  Problem Relation Age of Onset  . Uterine cancer Mother     Social History Social History   Tobacco Use  . Smoking status: Never Smoker  . Smokeless tobacco: Never Used  Substance Use Topics  . Alcohol use: No  . Drug use: No     Allergies   Patient has no known allergies.   Review of Systems Review of Systems  HENT: Positive for congestion.     Respiratory: Positive for cough.   Cardiovascular: Positive for palpitations.  Psychiatric/Behavioral: The patient is nervous/anxious.   All other systems reviewed and are negative.    Physical Exam Updated Vital Signs BP (!) 163/100   Pulse 70   Temp 98.4 F (36.9 C) (Oral)   Resp 18   Ht 5\' 1"  (1.549 m)   Wt 72.6 kg (160 lb)   SpO2 100%   BMI 30.23 kg/m   Physical Exam  Constitutional: She is oriented to person, place, and time. She appears well-developed and well-nourished. No distress.  HENT:  Head: Normocephalic and atraumatic.  Right Ear: Hearing normal.  Left Ear: Hearing normal.  Nose: Nose normal.  Mouth/Throat: Oropharynx is clear and moist and mucous membranes are normal.  Eyes: Conjunctivae and EOM are normal. Pupils are equal, round, and reactive to light.  Neck: Normal range of motion. Neck supple.  Cardiovascular: Regular rhythm, S1 normal and S2 normal. Exam reveals no gallop and no friction rub.  No murmur heard. Pulmonary/Chest: Effort normal and breath sounds normal. No respiratory distress. She exhibits no tenderness.  Abdominal: Soft. Normal appearance and bowel sounds are normal. There is no hepatosplenomegaly. There is no tenderness. There is no rebound, no guarding, no tenderness at McBurney's point and negative Murphy's sign. No hernia.  Musculoskeletal:  Normal range of motion.  Neurological: She is alert and oriented to person, place, and time. She has normal strength. No cranial nerve deficit or sensory deficit. Coordination normal. GCS eye subscore is 4. GCS verbal subscore is 5. GCS motor subscore is 6.  Skin: Skin is warm, dry and intact. No rash noted. No cyanosis.  Psychiatric: She has a normal mood and affect. Her speech is normal and behavior is normal. Thought content normal.  Nursing note and vitals reviewed.    ED Treatments / Results  Labs (all labs ordered are listed, but only abnormal results are displayed) Labs Reviewed  BASIC  METABOLIC PANEL - Abnormal; Notable for the following components:      Result Value   Potassium 3.2 (*)    Calcium 8.0 (*)    Anion gap 3 (*)    All other components within normal limits    EKG  EKG Interpretation None       Radiology No results found.  Procedures Procedures (including critical care time)  Medications Ordered in ED Medications  LORazepam (ATIVAN) tablet 1 mg (1 mg Oral Given 12/22/17 0315)     Initial Impression / Assessment and Plan / ED Course  I have reviewed the triage vital signs and the nursing notes.  Pertinent labs & imaging results that were available during my care of the patient were reviewed by me and considered in my medical decision making (see chart for details).     Patient presents to the emergency department for evaluation of elevated blood pressure, heart palpitations and feeling of anxiety.  Patient has persistent palpitations despite having a heart rate in the 60s and 70s here in the ER.  No arrhythmia noted on the monitor.  She does appear very anxious.  She was given Ativan, fell asleep and her blood pressure significantly improved.  After she awakened her blood pressure came back up again.  She is still feeling some anxiousness.  Her blood work, however, is normal, normal renal function.  Patient counseled that she should take her lisinopril when she gets home, but can take an additional pill today if her pressures stay high and contact her primary care doctor Monday morning for recheck.  Final Clinical Impressions(s) / ED Diagnoses   Final diagnoses:  Anxiety  Essential hypertension    ED Discharge Orders    None       Gilda Crease, MD 12/22/17 [phone]

## 2018-02-03 ENCOUNTER — Encounter (HOSPITAL_COMMUNITY): Payer: Self-pay

## 2018-02-03 ENCOUNTER — Emergency Department (HOSPITAL_COMMUNITY): Payer: BLUE CROSS/BLUE SHIELD

## 2018-02-03 ENCOUNTER — Emergency Department (HOSPITAL_COMMUNITY)
Admission: EM | Admit: 2018-02-03 | Discharge: 2018-02-03 | Disposition: A | Discharge status: Home / Self Care | Payer: BLUE CROSS/BLUE SHIELD | Attending: Emergency Medicine | Admitting: Emergency Medicine

## 2018-02-03 DIAGNOSIS — N189 Chronic kidney disease, unspecified: Secondary | ICD-10-CM | POA: Insufficient documentation

## 2018-02-03 DIAGNOSIS — Y939 Activity, unspecified: Secondary | ICD-10-CM | POA: Diagnosis not present

## 2018-02-03 DIAGNOSIS — Z79899 Other long term (current) drug therapy: Secondary | ICD-10-CM | POA: Diagnosis not present

## 2018-02-03 DIAGNOSIS — Y929 Unspecified place or not applicable: Secondary | ICD-10-CM | POA: Diagnosis not present

## 2018-02-03 DIAGNOSIS — I129 Hypertensive chronic kidney disease with stage 1 through stage 4 chronic kidney disease, or unspecified chronic kidney disease: Secondary | ICD-10-CM | POA: Insufficient documentation

## 2018-02-03 DIAGNOSIS — S93402A Sprain of unspecified ligament of left ankle, initial encounter: Secondary | ICD-10-CM | POA: Insufficient documentation

## 2018-02-03 DIAGNOSIS — IMO0001 Reserved for inherently not codable concepts without codable children: Secondary | ICD-10-CM

## 2018-02-03 DIAGNOSIS — Y998 Other external cause status: Secondary | ICD-10-CM | POA: Insufficient documentation

## 2018-02-03 DIAGNOSIS — W109XXA Fall (on) (from) unspecified stairs and steps, initial encounter: Secondary | ICD-10-CM | POA: Insufficient documentation

## 2018-02-03 DIAGNOSIS — S99912A Unspecified injury of left ankle, initial encounter: Secondary | ICD-10-CM | POA: Diagnosis present

## 2018-02-03 NOTE — ED Provider Notes (Signed)
[facility]-EMERGENCY DEPT Provider Note   CSN: [id number] Arrival date & time: 02/03/18  2014     History   Chief Complaint Chief Complaint  Patient presents with  . Ankle Pain    HPI Vanessa Collier is a 27 y.o. female who presents to the ED with ankle pain. The pain is located in the left lateral ankle. Patient reports that she was going down steps and tripped and turned her left ankle inward. She denies any other injuries.   HPI  Past Medical History:  Diagnosis Date  . Chronic kidney disease   . Hypertension     There are no active problems to display for this patient.   Past Surgical History:  Procedure Laterality Date  . TONSILLECTOMY      OB History    No data available       Home Medications    Prior to Admission medications   Medication Sig Start Date End Date Taking? Authorizing Provider  furosemide (LASIX) 20 MG tablet Take 20 mg by mouth daily.    [provider]  levothyroxine (SYNTHROID, LEVOTHROID) 125 MCG tablet Take 125 mcg by mouth daily before breakfast.    [provider]  lisinopril (PRINIVIL,ZESTRIL) 10 MG tablet Take 10 mg by mouth daily.    [provider]    Family History Family History  Problem Relation Age of Onset  . Uterine cancer Mother     Social History Social History   Tobacco Use  . Smoking status: Never Smoker  . Smokeless tobacco: Never Used  Substance Use Topics  . Alcohol use: No  . Drug use: No     Allergies   Patient has no known allergies.   Review of Systems Review of Systems  Musculoskeletal: Positive for arthralgias.       Left ankle pain  All other systems reviewed and are negative.    Physical Exam Updated Vital Signs BP (!) 166/101   Pulse 89   Temp 98.6 F (37 C) (Oral)   Resp 17   Ht 5\' 2"  (1.575 m)   Wt 76.2 kg (168 lb)   LMP 02/03/2018 (Approximate)   SpO2 98%   BMI 30.73 kg/m   Physical Exam  Constitutional: She appears  well-developed and well-nourished. No distress.  HENT:  Head: Normocephalic and atraumatic.  Eyes: EOM are normal.  Neck: Neck supple.  Cardiovascular: Normal rate.  Pulmonary/Chest: Effort normal.  Musculoskeletal:       Left ankle: She exhibits swelling. She exhibits no deformity, no laceration and normal pulse. Decreased range of motion: due to pain. Tenderness. Lateral malleolus tenderness found. Achilles tendon normal.  Pedal pulses 2+, adequate circulation. Swelling to the lateral aspect of the left ankle.   Neurological: She is alert.  Skin: Skin is warm and dry.  Psychiatric: She has a normal mood and affect.  Nursing note and vitals reviewed.    ED Treatments / Results  Labs (all labs ordered are listed, but only abnormal results are displayed) Labs Reviewed - No data to display  Radiology Dg Ankle Complete Left  Result Date: 02/03/2018 CLINICAL DATA:  27 year old female with fall and left ankle pain. EXAM: LEFT ANKLE COMPLETE - 3+ VIEW COMPARISON:  None. FINDINGS: There is no evidence of fracture, dislocation, or joint effusion. There is no evidence of arthropathy or other focal bone abnormality. Soft tissues are unremarkable. IMPRESSION: Negative. Electronically Signed   By: Elgie CollardArash  Radparvar M.D.   On: 02/03/2018 21:10  Procedures Procedures (including critical care time)  Medications Ordered in ED Medications - No data to display  Patient's BP is elevated but she reports that she takes Lisinopril and she saw her doctor today and he added another BP medication to try and get her pressure down. She has not filled the Rx yet.   Initial Impression / Assessment and Plan / ED Course  I have reviewed the triage vital signs and the nursing notes. 27 y.o. female with left ankle pain s/p fall stable for d/c without fracture or dislocation noted on x-ray and no focal neuro deficits. ASO applied, ice elevation and crutches. Tylenol prn.   Final Clinical Impressions(s) / ED  Diagnoses   Final diagnoses:  First degree ankle sprain, left, initial encounter    ED Discharge Orders    None       Kerrie Buffalo [place], [place] 02/03/18 2208    Linwood Dibbles, MD 02/03/18 [phone]

## 2018-02-03 NOTE — ED Triage Notes (Signed)
Pt presents with c/o left ankle pain. Pt reports she fell down the steps approx one hour ago. Pt reports she has not been able to put any weight on the foot since the injury.

## 2018-02-03 NOTE — Discharge Instructions (Signed)
Take tylenol as needed for pain

## 2018-02-20 ENCOUNTER — Other Ambulatory Visit: Payer: Self-pay | Admitting: Nurse Practitioner

## 2018-02-20 ENCOUNTER — Other Ambulatory Visit (HOSPITAL_COMMUNITY)
Admission: RE | Admit: 2018-02-20 | Discharge: 2018-02-20 | Disposition: A | Discharge status: Home / Self Care | Payer: BLUE CROSS/BLUE SHIELD | Source: Ambulatory Visit | Attending: Obstetrics and Gynecology | Admitting: Obstetrics and Gynecology

## 2018-02-20 DIAGNOSIS — Z01419 Encounter for gynecological examination (general) (routine) without abnormal findings: Secondary | ICD-10-CM | POA: Insufficient documentation

## 2018-02-24 LAB — CYTOLOGY - PAP: Diagnosis: NEGATIVE

## 2018-05-19 ENCOUNTER — Other Ambulatory Visit: Payer: Self-pay

## 2018-05-19 ENCOUNTER — Encounter (HOSPITAL_COMMUNITY): Payer: Self-pay | Admitting: Emergency Medicine

## 2018-05-19 DIAGNOSIS — Z5321 Procedure and treatment not carried out due to patient leaving prior to being seen by health care provider: Secondary | ICD-10-CM | POA: Diagnosis not present

## 2018-05-19 DIAGNOSIS — R1030 Lower abdominal pain, unspecified: Secondary | ICD-10-CM | POA: Diagnosis not present

## 2018-05-20 ENCOUNTER — Emergency Department (HOSPITAL_COMMUNITY)
Admission: EM | Admit: 2018-05-20 | Discharge: 2018-05-20 | Disposition: A | Discharge status: Home / Self Care | Payer: BLUE CROSS/BLUE SHIELD | Attending: Emergency Medicine | Admitting: Emergency Medicine

## 2018-05-20 MED ORDER — ACETAMINOPHEN 325 MG PO TABS
650.0000 mg | ORAL_TABLET | Freq: Once | ORAL | Status: AC
  Administered 2018-05-20: 650 mg via ORAL
  Filled 2018-05-20: qty 2

## 2018-05-20 NOTE — ED Triage Notes (Signed)
Pt complaining of lower abdominal pain that she says happens every time she has sex. Pt reports pain as an intermittent sharp cramping sensation. Pt also complaining of headache.

## 2018-05-20 NOTE — ED Notes (Signed)
Called pt for recheck of V/S No response 1st attempt

## 2018-05-20 NOTE — ED Notes (Signed)
Unable to collect labs no one responded to name call in the lobby

## 2018-09-10 DIAGNOSIS — M3214 Glomerular disease in systemic lupus erythematosus: Secondary | ICD-10-CM | POA: Insufficient documentation

## 2018-09-10 DIAGNOSIS — E039 Hypothyroidism, unspecified: Secondary | ICD-10-CM | POA: Insufficient documentation

## 2018-09-10 DIAGNOSIS — O0990 Supervision of high risk pregnancy, unspecified, unspecified trimester: Secondary | ICD-10-CM | POA: Insufficient documentation

## 2018-09-24 ENCOUNTER — Other Ambulatory Visit: Payer: Self-pay

## 2018-09-24 ENCOUNTER — Encounter (HOSPITAL_COMMUNITY): Payer: Self-pay | Admitting: Emergency Medicine

## 2018-09-24 ENCOUNTER — Emergency Department (HOSPITAL_COMMUNITY)
Admission: EM | Admit: 2018-09-24 | Discharge: 2018-09-24 | Disposition: A | Discharge status: Home / Self Care | Payer: BLUE CROSS/BLUE SHIELD | Attending: Emergency Medicine | Admitting: Emergency Medicine

## 2018-09-24 DIAGNOSIS — O26892 Other specified pregnancy related conditions, second trimester: Secondary | ICD-10-CM | POA: Diagnosis not present

## 2018-09-24 DIAGNOSIS — O10012 Pre-existing essential hypertension complicating pregnancy, second trimester: Secondary | ICD-10-CM | POA: Diagnosis not present

## 2018-09-24 DIAGNOSIS — R197 Diarrhea, unspecified: Secondary | ICD-10-CM | POA: Diagnosis not present

## 2018-09-24 DIAGNOSIS — R82998 Other abnormal findings in urine: Secondary | ICD-10-CM

## 2018-09-24 DIAGNOSIS — O2342 Unspecified infection of urinary tract in pregnancy, second trimester: Secondary | ICD-10-CM | POA: Insufficient documentation

## 2018-09-24 DIAGNOSIS — Z3A17 17 weeks gestation of pregnancy: Secondary | ICD-10-CM | POA: Diagnosis not present

## 2018-09-24 DIAGNOSIS — Z79899 Other long term (current) drug therapy: Secondary | ICD-10-CM | POA: Diagnosis not present

## 2018-09-24 LAB — COMPREHENSIVE METABOLIC PANEL
ALT: 14 U/L (ref 0–44)
AST: 18 U/L (ref 15–41)
Albumin: 3.1 g/dL — ABNORMAL LOW (ref 3.5–5.0)
Alkaline Phosphatase: 54 U/L (ref 38–126)
Anion gap: 8 (ref 5–15)
BUN: 6 mg/dL (ref 6–20)
CO2: 20 mmol/L — ABNORMAL LOW (ref 22–32)
Calcium: 9.2 mg/dL (ref 8.9–10.3)
Chloride: 107 mmol/L (ref 98–111)
Creatinine, Ser: 0.6 mg/dL (ref 0.44–1.00)
GFR calc Af Amer: >60 mL/min (ref >60)
GFR calc non Af Amer: >60 mL/min (ref >60)
Glucose, Bld: 100 mg/dL — ABNORMAL HIGH (ref 70–99)
Potassium: 3.8 mmol/L (ref 3.5–5.1)
Sodium: 135 mmol/L (ref 135–145)
Total Bilirubin: 0.3 mg/dL (ref 0.3–1.2)
Total Protein: 6.1 g/dL — ABNORMAL LOW (ref 6.5–8.1)

## 2018-09-24 LAB — URINALYSIS, ROUTINE W REFLEX MICROSCOPIC
Bilirubin Urine: NEGATIVE
Glucose, UA: NEGATIVE mg/dL
Ketones, ur: NEGATIVE mg/dL
Nitrite: NEGATIVE
Protein, ur: 30 mg/dL — AB
Specific Gravity, Urine: 1.012 (ref 1.005–1.030)
pH: 7 (ref 5.0–8.0)

## 2018-09-24 LAB — CBC
HCT: 31.5 % — ABNORMAL LOW (ref 36.0–46.0)
Hemoglobin: 10.1 g/dL — ABNORMAL LOW (ref 12.0–15.0)
MCH: 28.3 pg (ref 26.0–34.0)
MCHC: 32.1 g/dL (ref 30.0–36.0)
MCV: 88.2 fL (ref 80.0–100.0)
Platelets: 238 10*3/uL (ref 150–400)
RBC: 3.57 MIL/uL — ABNORMAL LOW (ref 3.87–5.11)
RDW: 12.9 % (ref 11.5–15.5)
WBC: 8 10*3/uL (ref 4.0–10.5)
nRBC: 0 % (ref 0.0–0.2)

## 2018-09-24 LAB — HCG, QUANTITATIVE, PREGNANCY: hCG, Beta Chain, Quant, S: 27124 m[IU]/mL — ABNORMAL HIGH (ref <5)

## 2018-09-24 LAB — LIPASE, BLOOD: Lipase: 26 U/L (ref 11–51)

## 2018-09-24 MED ORDER — CEPHALEXIN 500 MG PO CAPS: 500.0000 mg | ORAL_CAPSULE | Freq: Four times a day (QID) | ORAL | 0 refills | Status: DC

## 2018-09-24 NOTE — ED Notes (Signed)
  Fetal heart tones were assessed on patient with HR stable between 145-151.

## 2018-09-24 NOTE — ED Triage Notes (Signed)
Pt reports ingesting mold on a salad yesterday and today she is having diarrhea, abdominal pain, nausea, sore throat, and nasal congestion. Pt states she is [redacted]wks pregnant

## 2018-09-24 NOTE — ED Provider Notes (Signed)
Patient placed in Quick Look pathway, seen and evaluated   Chief Complaint: vomiting and diarrhea  HPI:   Pt complains of vomiting, diarrhea and abdominal pain after eating molded tomatoes at [employer]   ROS: no fever no chills  Physical Exam:   Gen: No distress  Neuro: Awake and Alert  Skin: Warm    Focused Exam: abdomen soft    Initiation of care has begun. The patient has been counseled on the process, plan, and necessity for staying for the completion/evaluation, and the remainder of the medical screening examination   Elson Areas, PA-C 09/24/18 1803    Virgina Norfolk, DO 09/25/18 1610

## 2018-09-24 NOTE — ED Provider Notes (Signed)
[facility] EMERGENCY DEPARTMENT Provider Note   CSN: [id number] Arrival date & time: 09/24/18  1738     History   Chief Complaint Chief Complaint  Patient presents with  . Diarrhea  . Mold Ingestion    HPI Vanessa Collier is a 27 y.o. female.  The history is provided by the patient and medical records. No language interpreter was used.   Vanessa Collier is a 27 y.o. female currently [redacted] weeks pregnant who presents to the Emergency Department complaining of diarrhea which began this morning.  Patient states that yesterday around lunch, she ate an [employer] salad that she had ordered to go.  She was eating a second salad from [employer] when she noticed that 1 of the tomatoes had mold on it.  She states it was a black color.  She assumes that more than just one tomato had this appearance .  She felt fine shortly after, however when she awoke this morning, she had multiple episodes of loose, nonbloody stools.  She states that she has had 4-5 episodes of diarrhea now.  She reports some diffuse lower abdominal cramping, but states that it is not very painful.  She denies any vaginal discharge, fluid leakage, dysuria, urinary urgency or frequency.  No vomiting or fevers.  No medications taken prior to arrival for symptoms.  No recent travel.  No known sick contacts.  Past Medical History:  Diagnosis Date  . Chronic kidney disease   . Hypertension     There are no active problems to display for this patient.   Past Surgical History:  Procedure Laterality Date  . TONSILLECTOMY       OB History    Gravida  1   Para      Term      Preterm      AB      Living        SAB      TAB      Ectopic      Multiple      Live Births               Home Medications    Prior to Admission medications   Medication Sig Start Date End Date Taking? Authorizing Provider  cephALEXin (KEFLEX) 500 MG capsule Take 1 capsule (500 mg total) by mouth 4 (four)  times daily. 09/24/18   Kendry Pfarr, Chase Picket, PA-C  furosemide (LASIX) 20 MG tablet Take 20 mg by mouth daily.    [provider]  levothyroxine (SYNTHROID, LEVOTHROID) 125 MCG tablet Take 125 mcg by mouth daily before breakfast.    [provider]  lisinopril (PRINIVIL,ZESTRIL) 10 MG tablet Take 10 mg by mouth daily.    [provider]    Family History Family History  Problem Relation Age of Onset  . Uterine cancer Mother     Social History Social History   Tobacco Use  . Smoking status: Never Smoker  . Smokeless tobacco: Never Used  Substance Use Topics  . Alcohol use: No  . Drug use: No     Allergies   Patient has no known allergies.   Review of Systems Review of Systems  Gastrointestinal: Positive for abdominal pain (Cramping) and diarrhea. Negative for blood in stool, nausea and vomiting.  All other systems reviewed and are negative.    Physical Exam Updated Vital Signs BP (!) 142/89   Pulse 65   Temp 98.5 F (36.9 C) (Oral)  Resp 18   Ht 5\' 1"  (1.549 m)   Wt 76.2 kg   LMP 04/27/2018   SpO2 100%   BMI 31.74 kg/m   Physical Exam  Constitutional: She is oriented to person, place, and time. She appears well-developed and well-nourished. No distress.  HENT:  Head: Normocephalic and atraumatic.  Cardiovascular: Normal rate, regular rhythm and normal heart sounds.  No murmur heard. Pulmonary/Chest: Effort normal and breath sounds normal. No respiratory distress.  Abdominal: Soft. She exhibits no distension.  No abdominal tenderness.  Musculoskeletal: She exhibits no edema.  Neurological: She is alert and oriented to person, place, and time.  Skin: Skin is warm and dry.  Nursing note and vitals reviewed.    ED Treatments / Results  Labs (all labs ordered are listed, but only abnormal results are displayed) Labs Reviewed  COMPREHENSIVE METABOLIC PANEL - Abnormal; Notable for the following components:      Result Value    CO2 20 (*)    Glucose, Bld 100 (*)    Total Protein 6.1 (*)    Albumin 3.1 (*)    All other components within normal limits  CBC - Abnormal; Notable for the following components:   RBC 3.57 (*)    Hemoglobin 10.1 (*)    HCT 31.5 (*)    All other components within normal limits  URINALYSIS, ROUTINE W REFLEX MICROSCOPIC - Abnormal; Notable for the following components:   Hgb urine dipstick SMALL (*)    Protein, ur 30 (*)    Leukocytes, UA TRACE (*)    Bacteria, UA RARE (*)    All other components within normal limits  HCG, QUANTITATIVE, PREGNANCY - Abnormal; Notable for the following components:   hCG, Beta Chain, Quant, S 27,124 (*)    All other components within normal limits  LIPASE, BLOOD    EKG None  Radiology No results found.  Procedures Procedures (including critical care time)  Medications Ordered in ED Medications - No data to display   Initial Impression / Assessment and Plan / ED Course  I have reviewed the triage vital signs and the nursing notes.  Pertinent labs & imaging results that were available during my care of the patient were reviewed by me and considered in my medical decision making (see chart for details).    Vanessa Collier is a 27 y.o. female who presents to ED for evaluation of diarrhea.  Patient states that she ate a tomato that was bad around noon yesterday.  When she awoke this morning, she began experiencing loose, nonbloody stools.  Had 4-5 episodes.  She reports some abdominal cramping with her bowel movements, but no other abdominal pain.  She is currently [redacted] weeks pregnant.  Fetal heart tones in the 140s.  She has no abdominal tenderness.  No vaginal discharge, leakage of fluids, vaginal bleeding.  She is afebrile and hemodynamically stable.  Her blood work has been reviewed and reassuring.  She is mildly anemic, however she does state that she was recently placed on prenatal vitamin with iron supplementation.  Encouraged her to continue this.   Urinalysis reviewed showing trace leukocytes and rare bacteria.  Given that she is pregnant and has had some abdominal cramping, will cover with Keflex.  He will call her OB/GYN in the morning to schedule close follow-up, preferably tomorrow or Friday.  We spoke at length about strict return precautions including change in her abdominal pain, vomiting, development of fever or any new symptoms.  She understands and agrees  with plan as dictated above.  All questions answered.  Patient discussed with Dr. Rush Landmark who agrees with treatment plan.   Final Clinical Impressions(s) / ED Diagnoses   Final diagnoses:  Diarrhea, unspecified type  Leukocytes in urine    ED Discharge Orders         Ordered    cephALEXin (KEFLEX) 500 MG capsule  4 times daily     09/24/18 2102           Nealie Mchatton, Chase Picket, PA-C 09/24/18 2125    Tegeler, Canary Brim, MD 09/25/18 [phone]

## 2018-09-24 NOTE — Discharge Instructions (Signed)
It was my pleasure taking care of you today!   As we discussed, you had a small amount of bacteria in your urine. Because of this, we have given you an antibiotic. Take this until completion.   Call your OBGYN tomorrow to schedule a follow up appointment.   Return to ER for fevers, vomiting, worsening abdominal pain, new or worsening symptoms, any additional concerns.

## 2018-09-24 NOTE — ED Notes (Signed)
Pt. Given sprite and told to drink as much as possible.

## 2018-09-24 NOTE — ED Notes (Signed)
Pt denies any vaginal bleeding or loss of fluids. States she gets routine prenatal care at [facility] physicians.

## 2018-10-20 ENCOUNTER — Inpatient Hospital Stay (HOSPITAL_COMMUNITY)
Admission: AD | Admit: 2018-10-20 | Discharge: 2018-10-20 | Disposition: A | Discharge status: Home / Self Care | Payer: BLUE CROSS/BLUE SHIELD | Source: Ambulatory Visit | Attending: Obstetrics and Gynecology | Admitting: Obstetrics and Gynecology

## 2018-10-20 ENCOUNTER — Encounter (HOSPITAL_COMMUNITY): Payer: Self-pay | Admitting: *Deleted

## 2018-10-20 ENCOUNTER — Other Ambulatory Visit: Payer: Self-pay

## 2018-10-20 DIAGNOSIS — O162 Unspecified maternal hypertension, second trimester: Secondary | ICD-10-CM | POA: Diagnosis not present

## 2018-10-20 DIAGNOSIS — O26832 Pregnancy related renal disease, second trimester: Secondary | ICD-10-CM | POA: Diagnosis not present

## 2018-10-20 DIAGNOSIS — O9989 Other specified diseases and conditions complicating pregnancy, childbirth and the puerperium: Secondary | ICD-10-CM | POA: Insufficient documentation

## 2018-10-20 DIAGNOSIS — Z79899 Other long term (current) drug therapy: Secondary | ICD-10-CM | POA: Insufficient documentation

## 2018-10-20 DIAGNOSIS — Z3A20 20 weeks gestation of pregnancy: Secondary | ICD-10-CM | POA: Diagnosis not present

## 2018-10-20 DIAGNOSIS — O26892 Other specified pregnancy related conditions, second trimester: Secondary | ICD-10-CM | POA: Diagnosis not present

## 2018-10-20 DIAGNOSIS — M329 Systemic lupus erythematosus, unspecified: Secondary | ICD-10-CM | POA: Insufficient documentation

## 2018-10-20 DIAGNOSIS — R519 Headache, unspecified: Secondary | ICD-10-CM

## 2018-10-20 DIAGNOSIS — N189 Chronic kidney disease, unspecified: Secondary | ICD-10-CM | POA: Insufficient documentation

## 2018-10-20 DIAGNOSIS — Z3492 Encounter for supervision of normal pregnancy, unspecified, second trimester: Secondary | ICD-10-CM

## 2018-10-20 DIAGNOSIS — R51 Headache: Secondary | ICD-10-CM | POA: Diagnosis present

## 2018-10-20 HISTORY — DX: Systemic lupus erythematosus, unspecified: M32.9

## 2018-10-20 HISTORY — DX: Reserved for concepts with insufficient information to code with codable children: IMO0002

## 2018-10-20 LAB — URINALYSIS, ROUTINE W REFLEX MICROSCOPIC
Bacteria, UA: NONE SEEN
Bilirubin Urine: NEGATIVE
Glucose, UA: NEGATIVE mg/dL
Ketones, ur: NEGATIVE mg/dL
Leukocytes, UA: NEGATIVE
Nitrite: NEGATIVE
Protein, ur: NEGATIVE mg/dL
Specific Gravity, Urine: 1.011 (ref 1.005–1.030)
pH: 7 (ref 5.0–8.0)

## 2018-10-20 MED ORDER — LACTATED RINGERS IV SOLN
INTRAVENOUS | Status: DC
  Administered 2018-10-20: 13:00:00 via INTRAVENOUS

## 2018-10-20 MED ORDER — DIPHENHYDRAMINE HCL 50 MG/ML IJ SOLN
25.0000 mg | Freq: Once | INTRAMUSCULAR | Status: AC
  Administered 2018-10-20: 25 mg via INTRAVENOUS
  Filled 2018-10-20: qty 1

## 2018-10-20 MED ORDER — DEXAMETHASONE SODIUM PHOSPHATE 10 MG/ML IJ SOLN
10.0000 mg | Freq: Once | INTRAMUSCULAR | Status: AC
  Administered 2018-10-20: 10 mg via INTRAVENOUS
  Filled 2018-10-20: qty 1

## 2018-10-20 MED ORDER — METOCLOPRAMIDE HCL 5 MG/ML IJ SOLN
10.0000 mg | Freq: Once | INTRAMUSCULAR | Status: AC
  Administered 2018-10-20: 10 mg via INTRAVENOUS
  Filled 2018-10-20: qty 2

## 2018-10-20 NOTE — MAU Note (Signed)
Been having migraines since Friday, Tylenol is not working.  It will calm it down, but returns.  Dr Dion BodyVarnado wanted her to come get checked out. Has a hx of Pre-E

## 2018-10-20 NOTE — MAU Provider Note (Addendum)
History     CSN: [id number]  Arrival date and time: 10/20/18 1138   First Provider Initiated Contact with Patient 10/20/18 1235      Chief Complaint  Patient presents with  . Headache   HPI  Vanessa Collier is a 27 y.o. G1P0 at [redacted]w[redacted]d who presents to MAU for evaluation of severe headache in the setting of Chronic Hypertension and history of migraines. Current episode began Friday 11/22. Patient endorses left-sided frontal headache 7-10/10. Does not radiate, no aggravating or alleviating factors. She is managing with Tylenol q 4 hours, last taken at about 7am today.  Patient states she is compliant with prescribed Labetalol 200 mg BID. She takes her blood pressure at home twice daily. She endorses normal BPs on her home cuff. She denies RUQ pain, visual disturbances, SOB.   Patient denies vaginal bleeding, leaking of fluid, decreased fetal movement, fever, falls, or recent illness.    OB History    Gravida  1   Para      Term      Preterm      AB      Living        SAB      TAB      Ectopic      Multiple      Live Births              Past Medical History:  Diagnosis Date  . Chronic kidney disease   . Hypertension   . Lupus [facility])     Past Surgical History:  Procedure Laterality Date  . TONSILLECTOMY      Family History  Problem Relation Age of Onset  . Uterine cancer Mother     Social History   Tobacco Use  . Smoking status: Never Smoker  . Smokeless tobacco: Never Used  Substance Use Topics  . Alcohol use: No  . Drug use: No    Allergies: No Known Allergies  Medications Prior to Admission  Medication Sig Dispense Refill Last Dose  . cephALEXin (KEFLEX) 500 MG capsule Take 1 capsule (500 mg total) by mouth 4 (four) times daily. 28 capsule 0   . furosemide (LASIX) 20 MG tablet Take 20 mg by mouth daily.   06/03/2017 at 0800  . levothyroxine (SYNTHROID, LEVOTHROID) 125 MCG tablet Take 125 mcg by mouth daily before breakfast.   06/03/2017 at  0800  . lisinopril (PRINIVIL,ZESTRIL) 10 MG tablet Take 10 mg by mouth daily.   06/01/2017 at 2000    Review of Systems  Constitutional: Negative for chills and fever.  Respiratory: Negative for chest tightness and shortness of breath.   Gastrointestinal: Negative for abdominal pain.  Genitourinary: Negative for flank pain, vaginal bleeding, vaginal discharge and vaginal pain.  Neurological: Positive for headaches. Negative for dizziness, syncope, speech difficulty and weakness.  All other systems reviewed and are negative.  Physical Exam   Blood pressure 119/81, pulse 68, temperature 98.2 F (36.8 C), temperature source Oral, resp. rate 17, weight 76.7 kg, last menstrual period 04/27/2018, SpO2 100 %.  Physical Exam  Nursing note and vitals reviewed. Constitutional: She is oriented to person, place, and time. She appears well-developed and well-nourished.  Cardiovascular: Normal rate, normal heart sounds and intact distal pulses.  Respiratory: Effort normal and breath sounds normal.  GI: Soft. She exhibits no distension. There is no tenderness. There is no rebound and no guarding.  Gravid  Neurological: She is alert and oriented to person, place, and time. She has  normal strength.  Skin: Skin is warm and dry.  Psychiatric: She has a normal mood and affect. Her behavior is normal. Judgment and thought content normal.    MAU Course/MDM   --Normotensive --Patient prenatal record reviewed.  --Discussed low stim environment with onset of headache: no phone, low/no lighting --Patient drove herself to MAU. Discussed with her that headache cocktail is likely to make her sleepy. Asked her to call support person to drive her home and confirm they are available to do so prior to administering headache medicine. --Pain reduced from 9 top 4-5/10 with medications administered in MAU  Patient Vitals for the past 24 hrs:  BP Temp Temp src Pulse Resp SpO2 Weight  10/20/18 1446 133/76 (!) 97.5 F  (36.4 C) Oral 74 18 100 % -  10/20/18 1149 119/81 98.2 F (36.8 C) Oral 68 17 100 % 76.7 kg    Results for orders placed or performed during the hospital encounter of 10/20/18 (from the past 24 hour(s))  Urinalysis, Routine w reflex microscopic     Status: Abnormal   Collection Time: 10/20/18 11:57 AM  Result Value Ref Range   Color, Urine YELLOW YELLOW   APPearance CLEAR CLEAR   Specific Gravity, Urine 1.011 1.005 - 1.030   pH 7.0 5.0 - 8.0   Glucose, UA NEGATIVE NEGATIVE mg/dL   Hgb urine dipstick SMALL (A) NEGATIVE   Bilirubin Urine NEGATIVE NEGATIVE   Ketones, ur NEGATIVE NEGATIVE mg/dL   Protein, ur NEGATIVE NEGATIVE mg/dL   Nitrite NEGATIVE NEGATIVE   Leukocytes, UA NEGATIVE NEGATIVE   RBC / HPF 0-5 0 - 5 RBC/hpf   WBC, UA 0-5 0 - 5 WBC/hpf   Bacteria, UA NONE SEEN NONE SEEN   Squamous Epithelial / LPF 0-5 0 - 5   Mucus PRESENT     Meds ordered this encounter  Medications  . dexamethasone (DECADRON) injection 10 mg  . metoCLOPramide (REGLAN) injection 10 mg  . diphenhydrAMINE (BENADRYL) injection 25 mg  . lactated ringers infusion    Assessment and Plan  --27 y.o. G1P0 at 1563w4d  --FHT 156 by Doppler --Headache significantly reduced with medications given in MAU (4/10) --Discharge home in stable condition, patient being driven home by friend  F/U: Patient has next OB appt first week in December  Vanessa Collier, [place] 10/20/2018, 2:54 PM

## 2018-10-20 NOTE — Discharge Instructions (Signed)

## 2018-12-13 ENCOUNTER — Encounter (HOSPITAL_COMMUNITY): Payer: Self-pay

## 2018-12-13 ENCOUNTER — Inpatient Hospital Stay (HOSPITAL_COMMUNITY)
Admission: AD | Admit: 2018-12-13 | Discharge: 2018-12-13 | Disposition: A | Discharge status: Home / Self Care | Payer: Medicaid Other | Source: Ambulatory Visit | Attending: Obstetrics & Gynecology | Admitting: Obstetrics & Gynecology

## 2018-12-13 DIAGNOSIS — O10013 Pre-existing essential hypertension complicating pregnancy, third trimester: Secondary | ICD-10-CM | POA: Insufficient documentation

## 2018-12-13 DIAGNOSIS — O10913 Unspecified pre-existing hypertension complicating pregnancy, third trimester: Secondary | ICD-10-CM | POA: Diagnosis not present

## 2018-12-13 DIAGNOSIS — G43701 Chronic migraine without aura, not intractable, with status migrainosus: Secondary | ICD-10-CM | POA: Diagnosis not present

## 2018-12-13 DIAGNOSIS — Z3A28 28 weeks gestation of pregnancy: Secondary | ICD-10-CM | POA: Diagnosis not present

## 2018-12-13 DIAGNOSIS — R030 Elevated blood-pressure reading, without diagnosis of hypertension: Secondary | ICD-10-CM | POA: Diagnosis present

## 2018-12-13 DIAGNOSIS — O99353 Diseases of the nervous system complicating pregnancy, third trimester: Secondary | ICD-10-CM | POA: Diagnosis not present

## 2018-12-13 DIAGNOSIS — Z7982 Long term (current) use of aspirin: Secondary | ICD-10-CM | POA: Insufficient documentation

## 2018-12-13 DIAGNOSIS — G43909 Migraine, unspecified, not intractable, without status migrainosus: Secondary | ICD-10-CM | POA: Insufficient documentation

## 2018-12-13 HISTORY — DX: Migraine, unspecified, not intractable, without status migrainosus: G43.909

## 2018-12-13 LAB — COMPREHENSIVE METABOLIC PANEL
ALT: 16 U/L (ref 0–44)
AST: 19 U/L (ref 15–41)
Albumin: 3 g/dL — ABNORMAL LOW (ref 3.5–5.0)
Alkaline Phosphatase: 88 U/L (ref 38–126)
Anion gap: 6 (ref 5–15)
BUN: 9 mg/dL (ref 6–20)
CO2: 20 mmol/L — ABNORMAL LOW (ref 22–32)
Calcium: 9 mg/dL (ref 8.9–10.3)
Chloride: 107 mmol/L (ref 98–111)
Creatinine, Ser: 0.45 mg/dL (ref 0.44–1.00)
GFR calc Af Amer: >60 mL/min (ref >60)
GFR calc non Af Amer: >60 mL/min (ref >60)
Glucose, Bld: 88 mg/dL (ref 70–99)
Potassium: 3.7 mmol/L (ref 3.5–5.1)
Sodium: 133 mmol/L — ABNORMAL LOW (ref 135–145)
Total Bilirubin: 0.4 mg/dL (ref 0.3–1.2)
Total Protein: 5.9 g/dL — ABNORMAL LOW (ref 6.5–8.1)

## 2018-12-13 LAB — URINALYSIS, ROUTINE W REFLEX MICROSCOPIC
Bacteria, UA: NONE SEEN
Bilirubin Urine: NEGATIVE
Glucose, UA: NEGATIVE mg/dL
Hgb urine dipstick: NEGATIVE
Ketones, ur: NEGATIVE mg/dL
Leukocytes, UA: NEGATIVE
Nitrite: NEGATIVE
Protein, ur: 100 mg/dL — AB
Specific Gravity, Urine: 1.023 (ref 1.005–1.030)
pH: 6 (ref 5.0–8.0)

## 2018-12-13 LAB — CBC
HCT: 33.2 % — ABNORMAL LOW (ref 36.0–46.0)
Hemoglobin: 11 g/dL — ABNORMAL LOW (ref 12.0–15.0)
MCH: 29.7 pg (ref 26.0–34.0)
MCHC: 33.1 g/dL (ref 30.0–36.0)
MCV: 89.7 fL (ref 80.0–100.0)
Platelets: 236 10*3/uL (ref 150–400)
RBC: 3.7 MIL/uL — ABNORMAL LOW (ref 3.87–5.11)
RDW: 13.3 % (ref 11.5–15.5)
WBC: 11.5 10*3/uL — ABNORMAL HIGH (ref 4.0–10.5)
nRBC: 0 % (ref 0.0–0.2)

## 2018-12-13 LAB — PROTEIN / CREATININE RATIO, URINE
Creatinine, Urine: 189 mg/dL
Protein Creatinine Ratio: 0.6 mg/mg{Cre} — ABNORMAL HIGH (ref 0.00–0.15)
Total Protein, Urine: 113 mg/dL

## 2018-12-13 MED ORDER — SUMATRIPTAN SUCCINATE 50 MG PO TABS
50.0000 mg | ORAL_TABLET | Freq: Once | ORAL | Status: AC
  Administered 2018-12-13: 50 mg via ORAL
  Filled 2018-12-13: qty 1

## 2018-12-13 MED ORDER — SUMATRIPTAN SUCCINATE 50 MG PO TABS: 50.0000 mg | ORAL_TABLET | ORAL | 0 refills | Status: DC

## 2018-12-13 NOTE — MAU Provider Note (Signed)
Chief Complaint  Patient presents with  . Headache     First Provider Initiated Contact with Patient 12/13/18 Ernestina Columbia      S: Vanessa Collier  is a 28 y.o. y.o. year old G1P0 female at [redacted]w[redacted]d weeks gestation who presents to MAU with migraine HA and elevated blood pressure x 1. Hx CHTN. Current blood pressure medication: Labetalol. States usually takes Imitrex good results, but did not know she could take it in pregnancy.  No improvement with Tylenol.  Associated symptoms: Positive for headache, negative for vision changes or epigastric pain Contractions: Denies Vaginal bleeding: Denies Fetal movement: Very active  Pregnancy significant for chronic hypertension, lupus nephritis.  History of migraines.  Baseline 24-hour urine 599 mg  O:  Patient Vitals for the past 24 hrs:  BP Temp Temp src Pulse Resp SpO2 Weight  12/13/18 1931 130/87 - - 74 - - -  12/13/18 1905 119/86 - - 84 - - -  12/13/18 1800 134/90 97.8 F (36.6 C) Oral 79 18 100 % 81.2 kg   General: NAD Heart: Regular rate Lungs: Normal rate and effort Abd: Soft, NT, Gravid, S=D Extremities: No pedal edema Neuro: 2+ deep tendon reflexes, No clonus Pelvic: NEFG, no bleeding or LOF.      EFM: 140, Moderate variability, 10x10 accelerations, no decelerations, very intermittent tracing due to active fetus.  EFM adjusted numerous times by 4 different people.  Fetal movement visible by examiner and audible by EFM. Toco: None  Results for orders placed or performed during the hospital encounter of 12/13/18 (from the past 24 hour(s))  Urinalysis, Routine w reflex microscopic     Status: Abnormal   Collection Time: 12/13/18  6:07 PM  Result Value Ref Range   Color, Urine YELLOW YELLOW   APPearance CLEAR CLEAR   Specific Gravity, Urine 1.023 1.005 - 1.030   pH 6.0 5.0 - 8.0   Glucose, UA NEGATIVE NEGATIVE mg/dL   Hgb urine dipstick NEGATIVE NEGATIVE   Bilirubin Urine NEGATIVE NEGATIVE   Ketones, ur NEGATIVE NEGATIVE mg/dL   Protein, ur 009 (A) NEGATIVE mg/dL   Nitrite NEGATIVE NEGATIVE   Leukocytes, UA NEGATIVE NEGATIVE   RBC / HPF 6-10 0 - 5 RBC/hpf   WBC, UA 0-5 0 - 5 WBC/hpf   Bacteria, UA NONE SEEN NONE SEEN   Squamous Epithelial / LPF 0-5 0 - 5   Mucus PRESENT   Protein / creatinine ratio, urine     Status: Abnormal   Collection Time: 12/13/18  6:07 PM  Result Value Ref Range   Creatinine, Urine 189.00 mg/dL   Total Protein, Urine 113 mg/dL   Protein Creatinine Ratio 0.60 (H) 0.00 - 0.15 mg/mg[Cre]  CBC     Status: Abnormal   Collection Time: 12/13/18  6:51 PM  Result Value Ref Range   WBC 11.5 (H) 4.0 - 10.5 K/uL   RBC 3.70 (L) 3.87 - 5.11 MIL/uL   Hemoglobin 11.0 (L) 12.0 - 15.0 g/dL   HCT 23.3 (L) 00.7 - 62.2 %   MCV 89.7 80.0 - 100.0 fL   MCH 29.7 26.0 - 34.0 pg   MCHC 33.1 30.0 - 36.0 g/dL   RDW 63.3 35.4 - 56.2 %   Platelets 236 150 - 400 K/uL   nRBC 0.0 0.0 - 0.2 %  Comprehensive metabolic panel     Status: Abnormal   Collection Time: 12/13/18  6:51 PM  Result Value Ref Range   Sodium 133 (L) 135 - 145 mmol/L  Potassium 3.7 3.5 - 5.1 mmol/L   Chloride 107 98 - 111 mmol/L   CO2 20 (L) 22 - 32 mmol/L   Glucose, Bld 88 70 - 99 mg/dL   BUN 9 6 - 20 mg/dL   Creatinine, Ser 4.090.45 0.44 - 1.00 mg/dL   Calcium 9.0 8.9 - 81.110.3 mg/dL   Total Protein 5.9 (L) 6.5 - 8.1 g/dL   Albumin 3.0 (L) 3.5 - 5.0 g/dL   AST 19 15 - 41 U/L   ALT 16 0 - 44 U/L   Alkaline Phosphatase 88 38 - 126 U/L   Total Bilirubin 0.4 0.3 - 1.2 mg/dL   GFR calc non Af Amer >60 >60 mL/min   GFR calc Af Amer >60 >60 mL/min   Anion gap 6 5 - 15   MAU course/MDM Orders Placed This Encounter  Procedures  . Urinalysis, Routine w reflex microscopic  . CBC  . Comprehensive metabolic panel  . Protein / creatinine ratio, urine  . Discharge patient   Meds ordered this encounter  Medications  . SUMAtriptan (IMITREX) tablet 50 mg  . SUMAtriptan (IMITREX) 50 MG tablet    Sig: Take 1 tablet (50 mg total) by mouth as  directed. May repeat in 2 hours if headache persists or recurs. Do not exceed 200 mg in 24 hours.    Dispense:  5 tablet    Refill:  0    Order Specific Question:   Supervising Provider    Answer:   CONSTANT, PEGGY [[id number]]   Headache resolved with Imitrex.  Discussed that it is L3 per infant risk center and considered low risk for fetal harm based on Eppocrates.  Also discussed with Dr. Charlotta Newtonzan who is comfortable with patient using Imitrex in pregnancy  A: 149w2d week IUP Chronic hypertension without evidence of superimposed preeclampsia FHR reactive Chronic migraine non-intractable  P: Discharge home in stable condition. Dr. Jolayne Pantherconstant reviewed fetal heart rate tracing.  Reassuring for gestational age. Preeclampsia precautions. Follow-up Information    [facility], [facility].   Specialty:  Obstetrics and Gynecology Why:  as scheduled or sooner as needed if symptoms worsen Contact information: [address] [place] [address] [phone]        MFM Follow up on 12/16/2018.   Why:  as scheduled       [facility] Follow up.   Specialty:  Obstetrics and Gynecology Why:  As needed in pregnancy emergencies Contact information: [address] [id number] [place] [phone]         Allergies as of 12/13/2018   No Known Allergies     Medication List    TAKE these medications   aspirin EC 81 MG tablet Take 81 mg by mouth daily.   INTEGRA PO Take 1 tablet by mouth daily.   labetalol 200 MG tablet Commonly known as:  NORMODYNE   levothyroxine 125 MCG tablet Commonly known as:  SYNTHROID, LEVOTHROID Take 125 mcg by mouth daily before breakfast.   prenatal multivitamin Tabs tablet Take 1 tablet by mouth daily at 12 noon.   SUMAtriptan 50 MG tablet Commonly known as:  IMITREX Take 1 tablet (50 mg total) by mouth as directed. May repeat in 2 hours if headache persists or recurs.  Do not exceed 200 mg in 24 hours.   valACYclovir 500 MG tablet Commonly known as:  Tretha SciaraVALTREX        Lavida Patch, [place], CNM 12/13/2018 9:07 PM

## 2018-12-13 NOTE — MAU Note (Signed)
Pt has taken Imitrex before pregnancy for the migraines.

## 2018-12-13 NOTE — Discharge Instructions (Signed)
Migraine Headache A migraine headache is an intense, throbbing pain on one side or both sides of the head. Migraines may also cause other symptoms, such as nausea, vomiting, and sensitivity to light and noise. What are the causes? Doing or taking certain things may also trigger migraines, such as:  Alcohol.  Smoking.  Medicines, such as: ? Medicine used to treat chest pain (nitroglycerine). ? Birth control pills. ? Estrogen pills. ? Certain blood pressure medicines.  Aged cheeses, chocolate, or caffeine.  Foods or drinks that contain nitrates, glutamate, aspartame, or tyramine.  Physical activity. Other things that may trigger a migraine include:  Menstruation.  Pregnancy.  Hunger.  Stress, lack of sleep, too much sleep, or fatigue.  Weather changes. What increases the risk? The following factors may make you more likely to experience migraine headaches:  Age. Risk increases with age.  Family history of migraine headaches.  Being Caucasian.  Depression and anxiety.  Obesity.  Being a woman.  Having a hole in the heart (patent foramen ovale) or other heart problems. What are the signs or symptoms? The main symptom of this condition is pulsating or throbbing pain. Pain may:  Happen in any area of the head, such as on one side or both sides.  Interfere with daily activities.  Get worse with physical activity.  Get worse with exposure to bright lights or loud noises. Other symptoms may include:  Nausea.  Vomiting.  Dizziness.  General sensitivity to bright lights, loud noises, or smells. Before you get a migraine, you may get warning signs that a migraine is developing (aura). An aura may include:  Seeing flashing lights or having blind spots.  Seeing bright spots, halos, or zigzag lines.  Having tunnel vision or blurred vision.  Having numbness or a tingling feeling.  Having trouble talking.  Having muscle weakness. How is this  diagnosed? A migraine headache can be diagnosed based on:  Your symptoms.  A physical exam.  Tests, such as CT scan or MRI of the head. These imaging tests can help rule out other causes of headaches.  Taking fluid from the spine (lumbar puncture) and analyzing it (cerebrospinal fluid analysis, or CSF analysis). How is this treated? A migraine headache is usually treated with medicines that:  Relieve pain.  Relieve nausea.  Prevent migraines from coming back. Treatment may also include:  Acupuncture.  Lifestyle changes like avoiding foods that trigger migraines. Follow these instructions at home: Medicines  Take over-the-counter and prescription medicines only as told by your health care provider.  Do not drive or use heavy machinery while taking prescription pain medicine.  To prevent or treat constipation while you are taking prescription pain medicine, your health care provider may recommend that you: ? Drink enough fluid to keep your urine clear or pale yellow. ? Take over-the-counter or prescription medicines. ? Eat foods that are high in fiber, such as fresh fruits and vegetables, whole grains, and beans. ? Limit foods that are high in fat and processed sugars, such as fried and sweet foods. Lifestyle  Avoid alcohol use.  Do not use any products that contain nicotine or tobacco, such as cigarettes and e-cigarettes. If you need help quitting, ask your health care provider.  Get at least 8 hours of sleep every night.  Limit your stress. General instructions      Keep a journal to find out what may trigger your migraine headaches. For example, write down: ? What you eat and drink. ? How much sleep  you get. ? Any change to your diet or medicines.  If you have a migraine: ? Avoid things that make your symptoms worse, such as bright lights. ? It may help to lie down in a dark, quiet room. ? Do not drive or use heavy machinery. ? Ask your health care provider  what activities are safe for you while you are experiencing symptoms.  Keep all follow-up visits as told by your health care provider. This is important. Contact a health care provider if:  You develop symptoms that are different or more severe than your usual migraine symptoms. Get help right away if:  Your migraine becomes severe.  You have a fever.  You have a stiff neck.  You have vision loss.  Your muscles feel weak or like you cannot control them.  You start to lose your balance often.  You develop trouble walking.  You faint. This information is not intended to replace advice given to you by your health care provider. Make sure you discuss any questions you have with your health care provider. Document Released: 11/12/2005 Document Revised: 06/01/2016 Document Reviewed: 04/30/2016 Elsevier Interactive Patient Education  2019 [employer]   Hypertension During Pregnancy  Hypertension, commonly called high blood pressure, is when the force of blood pumping through your arteries is too strong. Arteries are blood vessels that carry blood from the heart throughout the body. Hypertension during pregnancy can cause problems for you and your baby. Your baby may be born early (prematurely) or may not weigh as much as he or she should at birth. Very bad cases of hypertension during pregnancy can be life-threatening. Different types of hypertension can occur during pregnancy. These include:  Chronic hypertension. This happens when: ? You have hypertension before pregnancy and it continues during pregnancy. ? You develop hypertension before you are [redacted] weeks pregnant, and it continues during pregnancy.  Gestational hypertension. This is hypertension that develops after the 20th week of pregnancy.  Preeclampsia, also called toxemia of pregnancy. This is a very serious type of hypertension that develops during pregnancy. It can be very dangerous for you and your baby. ? In rare  cases, you may develop preeclampsia after giving birth (postpartum preeclampsia). This usually occurs within 48 hours after childbirth but may occur up to 6 weeks after giving birth. Gestational hypertension and preeclampsia usually go away within 6 weeks after your baby is born. Women who have hypertension during pregnancy have a greater chance of developing hypertension later in life or during future pregnancies. What are the causes? The exact cause of hypertension during pregnancy is not known. What increases the risk? There are certain factors that make it more likely for you to develop hypertension during pregnancy. These include:  Having hypertension during a previous pregnancy or prior to pregnancy.  Being overweight.  Being age 27 or older.  Being pregnant for the first time.  Being pregnant with more than one baby.  Becoming pregnant using fertilization methods such as IVF (in vitro fertilization).  Having diabetes, kidney problems, or systemic lupus erythematosus.  Having a family history of hypertension. What are the signs or symptoms? Chronic hypertension and gestational hypertension rarely cause symptoms. Preeclampsia causes symptoms, which may include:  Increased protein in your urine. Your health care provider will check for this at every visit before you give birth (prenatal visit).  Severe headaches.  Sudden weight gain.  Swelling of the hands, face, legs, and feet.  Nausea and vomiting.  Vision problems, such as blurred  or double vision.  Numbness in the face, arms, legs, and feet.  Dizziness.  Slurred speech.  Sensitivity to bright lights.  Abdominal pain.  Convulsions or seizures. How is this diagnosed? You may be diagnosed with hypertension during a routine prenatal exam. At each prenatal visit, you may:  Have a urine test to check for high amounts of protein in your urine.  Have your blood pressure checked. A blood pressure reading is given  as two numbers, such as "120 over 80" (or 120/80). The first ("top") number is a measure of the pressure in your arteries when your heart beats (systolic pressure). The second ("bottom") number is a measure of the pressure in your arteries as your heart relaxes between beats (diastolic pressure). Blood pressure is measured in a unit called mm Hg. For most women, a normal blood pressure reading is: ? Systolic: below 120. ? Diastolic: below 80. The type of hypertension that you are diagnosed with depends on your test results and when your symptoms developed.  Chronic hypertension is usually diagnosed before 20 weeks of pregnancy.  Gestational hypertension is usually diagnosed after 20 weeks of pregnancy.  Hypertension with high amounts of protein in the urine is diagnosed as preeclampsia.  Blood pressure measurements that stay above 160 systolic, or above 110 diastolic, are signs of severe preeclampsia. How is this treated? Treatment for hypertension during pregnancy varies depending on the type of hypertension you have and how serious it is.  If you take medicines called ACE inhibitors to treat chronic hypertension, you may need to switch medicines. ACE inhibitors should not be taken during pregnancy.  If you have gestational hypertension, you may need to take blood pressure medicine.  If you are at risk for preeclampsia, your health care provider may recommend that you take a low-dose aspirin during your pregnancy.  If you have severe preeclampsia, you may need to be hospitalized so you and your baby can be monitored closely. You may also need to take medicine (magnesium sulfate) to prevent seizures and to lower blood pressure. This medicine may be given as an injection or through an IV.  In some cases, if your condition gets worse, you may need to deliver your baby early. Follow these instructions at home: Eating and drinking   Drink enough fluid to keep your urine pale yellow.  Avoid  caffeine. Lifestyle  Do not use any products that contain nicotine or tobacco, such as cigarettes and e-cigarettes. If you need help quitting, ask your health care provider.  Do not use alcohol or drugs.  Avoid stress as much as possible. Rest and get plenty of sleep. General instructions  Take over-the-counter and prescription medicines only as told by your health care provider.  While lying down, lie on your left side. This keeps pressure off your major blood vessels.  While sitting or lying down, raise (elevate) your feet. Try putting some pillows under your lower legs.  Exercise regularly. Ask your health care provider what kinds of exercise are best for you.  Keep all prenatal and follow-up visits as told by your health care provider. This is important. Contact a health care provider if:  You have symptoms that your health care provider told you may require more treatment or monitoring, such as: ? Nausea or vomiting. ? Headache. Get help right away if you have:  Severe abdominal pain that does not get better with treatment.  A severe headache that does not get better.  Vomiting that does not get better.  Sudden, rapid weight gain.  Sudden swelling in your hands, ankles, or face.  Vaginal bleeding.  Blood in your urine.  Fewer movements from your baby than usual.  Blurred or double vision.  Muscle twitching or sudden muscle tightening (spasms).  Shortness of breath.  Blue fingernails or lips. Summary  Hypertension, commonly called high blood pressure, is when the force of blood pumping through your arteries is too strong.  Hypertension during pregnancy can cause problems for you and your baby.  Treatment for hypertension during pregnancy varies depending on the type of hypertension you have and how serious it is.  Get help right away if you have symptoms that your health care provider told you to watch for. This information is not intended to replace  advice given to you by your health care provider. Make sure you discuss any questions you have with your health care provider. Document Released: 07/31/2011 Document Revised: 10/29/2017 Document Reviewed: 04/27/2016 Elsevier Interactive Patient Education  2019 [employer]

## 2019-01-24 ENCOUNTER — Encounter (HOSPITAL_COMMUNITY): Payer: Self-pay | Admitting: *Deleted

## 2019-01-24 ENCOUNTER — Inpatient Hospital Stay (HOSPITAL_COMMUNITY)
Admission: AD | Admit: 2019-01-24 | Discharge: 2019-01-25 | Disposition: A | Discharge status: Home / Self Care | Payer: Medicaid Other | Source: Ambulatory Visit | Attending: Obstetrics and Gynecology | Admitting: Obstetrics and Gynecology

## 2019-01-24 DIAGNOSIS — Z7982 Long term (current) use of aspirin: Secondary | ICD-10-CM | POA: Diagnosis not present

## 2019-01-24 DIAGNOSIS — O26893 Other specified pregnancy related conditions, third trimester: Secondary | ICD-10-CM | POA: Diagnosis not present

## 2019-01-24 DIAGNOSIS — R51 Headache: Secondary | ICD-10-CM | POA: Diagnosis not present

## 2019-01-24 DIAGNOSIS — O10013 Pre-existing essential hypertension complicating pregnancy, third trimester: Secondary | ICD-10-CM | POA: Diagnosis not present

## 2019-01-24 DIAGNOSIS — R519 Headache, unspecified: Secondary | ICD-10-CM

## 2019-01-24 DIAGNOSIS — O365930 Maternal care for other known or suspected poor fetal growth, third trimester, not applicable or unspecified: Secondary | ICD-10-CM | POA: Insufficient documentation

## 2019-01-24 DIAGNOSIS — O10913 Unspecified pre-existing hypertension complicating pregnancy, third trimester: Secondary | ICD-10-CM

## 2019-01-24 DIAGNOSIS — O26833 Pregnancy related renal disease, third trimester: Secondary | ICD-10-CM

## 2019-01-24 DIAGNOSIS — Z3A34 34 weeks gestation of pregnancy: Secondary | ICD-10-CM | POA: Insufficient documentation

## 2019-01-24 LAB — URINALYSIS, ROUTINE W REFLEX MICROSCOPIC
Bacteria, UA: NONE SEEN
Bilirubin Urine: NEGATIVE
Glucose, UA: NEGATIVE mg/dL
Hgb urine dipstick: NEGATIVE
Ketones, ur: NEGATIVE mg/dL
Leukocytes,Ua: NEGATIVE
Nitrite: NEGATIVE
Protein, ur: 100 mg/dL — AB
Specific Gravity, Urine: 1.02 (ref 1.005–1.030)
pH: 6 (ref 5.0–8.0)

## 2019-01-24 LAB — CBC
HCT: 34.7 % — ABNORMAL LOW (ref 36.0–46.0)
Hemoglobin: 11.3 g/dL — ABNORMAL LOW (ref 12.0–15.0)
MCH: 29 pg (ref 26.0–34.0)
MCHC: 32.6 g/dL (ref 30.0–36.0)
MCV: 89 fL (ref 80.0–100.0)
Platelets: 219 10*3/uL (ref 150–400)
RBC: 3.9 MIL/uL (ref 3.87–5.11)
RDW: 13 % (ref 11.5–15.5)
WBC: 12.1 10*3/uL — ABNORMAL HIGH (ref 4.0–10.5)
nRBC: 0 % (ref 0.0–0.2)

## 2019-01-24 LAB — COMPREHENSIVE METABOLIC PANEL
ALT: 15 U/L (ref 0–44)
AST: 20 U/L (ref 15–41)
Albumin: 2.6 g/dL — ABNORMAL LOW (ref 3.5–5.0)
Alkaline Phosphatase: 149 U/L — ABNORMAL HIGH (ref 38–126)
Anion gap: 6 (ref 5–15)
BUN: 11 mg/dL (ref 6–20)
CO2: 21 mmol/L — ABNORMAL LOW (ref 22–32)
Calcium: 9.1 mg/dL (ref 8.9–10.3)
Chloride: 105 mmol/L (ref 98–111)
Creatinine, Ser: 0.73 mg/dL (ref 0.44–1.00)
GFR calc Af Amer: >60 mL/min (ref >60)
GFR calc non Af Amer: >60 mL/min (ref >60)
Glucose, Bld: 104 mg/dL — ABNORMAL HIGH (ref 70–99)
Potassium: 4.2 mmol/L (ref 3.5–5.1)
Sodium: 132 mmol/L — ABNORMAL LOW (ref 135–145)
Total Bilirubin: 0.3 mg/dL (ref 0.3–1.2)
Total Protein: 5.1 g/dL — ABNORMAL LOW (ref 6.5–8.1)

## 2019-01-24 LAB — PROTEIN / CREATININE RATIO, URINE
Creatinine, Urine: 178.2 mg/dL
Protein Creatinine Ratio: 1.48 mg/mg{Cre} — ABNORMAL HIGH (ref 0.00–0.15)
Total Protein, Urine: 263 mg/dL

## 2019-01-24 MED ORDER — BUTALBITAL-APAP-CAFFEINE 50-325-40 MG PO TABS
2.0000 | ORAL_TABLET | Freq: Once | ORAL | Status: AC
  Administered 2019-01-25: 2 via ORAL
  Filled 2019-01-24: qty 2

## 2019-01-24 NOTE — MAU Note (Signed)
Bad h/a yesterday and it went away. Was seen at high risk yesterday and baby not growing as expected. Headache started again today. Threw up once today. Denies vag bleeding or d/c.

## 2019-01-24 NOTE — MAU Provider Note (Signed)
Chief Complaint:  Headache and Emesis During Pregnancy   First Provider Initiated Contact with Patient 01/24/19 2244      HPI: Vanessa Collier is a 28 y.o. G3P0020 at [redacted]w[redacted]d with CHTN, Lupus with kidney disease, IUGR, hx migraines who presents to maternity admissions reporting onset of headache this morning that has not resolved.  Her h/a is frontal, constant, and associated with vomiting x 1 today.  She is on labetalol 400 mg twice per day, increased 3 days ago by her nephrologist.  She denies any visual changes or epigastric pain.  She reports good fetal movement and denies abdominal pain.  There are no other symptoms. She has not tried any treatments.    HPI  Past Medical History: Past Medical History:  Diagnosis Date  . Chronic kidney disease   . Hypertension   . Lupus (HCC)   . Migraines     Past obstetric history: OB History  Gravida Para Term Preterm AB Living  3       2    SAB TAB Ectopic Multiple Live Births               # Outcome Date GA Lbr Len/2nd Weight Sex Delivery Anes PTL Lv  3 Current           2 AB 2014          1 AB 2012            Past Surgical History: Past Surgical History:  Procedure Laterality Date  . TONSILLECTOMY      Family History: Family History  Problem Relation Age of Onset  . Uterine cancer Mother     Social History: Social History   Tobacco Use  . Smoking status: Never Smoker  . Smokeless tobacco: Never Used  Substance Use Topics  . Alcohol use: No  . Drug use: No    Allergies: No Known Allergies  Meds:  No medications prior to admission.    ROS:  Review of Systems  Constitutional: Negative for chills, fatigue and fever.  Eyes: Negative for visual disturbance.  Respiratory: Negative for shortness of breath.   Cardiovascular: Negative for chest pain.  Gastrointestinal: Negative for abdominal pain, nausea and vomiting.  Genitourinary: Negative for difficulty urinating, dysuria, flank pain, pelvic pain, vaginal bleeding,  vaginal discharge and vaginal pain.  Neurological: Positive for headaches. Negative for dizziness.  Psychiatric/Behavioral: Negative.      I have reviewed patient's Past Medical Hx, Surgical Hx, Family Hx, Social Hx, medications and allergies.   Physical Exam   Patient Vitals for the past 24 hrs:  BP Temp Temp src Pulse Resp Height Weight  01/25/19 0138 140/89 97.9 F (36.6 C) Oral 68 18 - -  01/24/19 2346 131/84 - - 67 18 - -  01/24/19 2331 128/82 - - 72 18 - -  01/24/19 2320 123/71 - - 71 18 - -  01/24/19 2301 114/74 - - 76 17 - -  01/24/19 2247 (!) 149/92 - - 67 18 - -  01/24/19 2231 (!) 138/91 - - 76 18 - -  01/24/19 2216 (!) 148/91 - - 72 - - -  01/24/19 2201 (!) 146/87 - - 67 - - -  01/24/19 2146 127/82 - - 75 - - -  01/24/19 2131 (!) 135/91 - - 67 - - -  01/24/19 2116 (!) 142/89 - - 61 - - -  01/24/19 2101 (!) 147/88 - - 60 - - -  01/24/19 2045 (!) 146/96 - -  62 - - -  01/24/19 2020 125/90 - - 74 - - -  01/24/19 2014 - 97.6 F (36.4 C) - - 20 5\' 1"  (1.549 m) 86.5 kg   Constitutional: Well-developed, well-nourished female in no acute distress.  Cardiovascular: normal rate Respiratory: normal effort GI: Abd soft, non-tender, gravid appropriate for gestational age.  MS: Extremities nontender, no edema, normal ROM Neurologic: Alert and oriented x 4.  GU: Neg CVAT.      FHT:  Baseline 135 , moderate variability, accelerations present, no decelerations Contractions: rare, mild to palpation   Labs: Results for orders placed or performed during the hospital encounter of 01/24/19 (from the past 24 hour(s))  Urinalysis, Routine w reflex microscopic     Status: Abnormal   Collection Time: 01/24/19  8:21 PM  Result Value Ref Range   Color, Urine YELLOW YELLOW   APPearance CLEAR CLEAR   Specific Gravity, Urine 1.020 1.005 - 1.030   pH 6.0 5.0 - 8.0   Glucose, UA NEGATIVE NEGATIVE mg/dL   Hgb urine dipstick NEGATIVE NEGATIVE   Bilirubin Urine NEGATIVE NEGATIVE    Ketones, ur NEGATIVE NEGATIVE mg/dL   Protein, ur 023 (A) NEGATIVE mg/dL   Nitrite NEGATIVE NEGATIVE   Leukocytes,Ua NEGATIVE NEGATIVE   RBC / HPF 0-5 0 - 5 RBC/hpf   WBC, UA 0-5 0 - 5 WBC/hpf   Bacteria, UA NONE SEEN NONE SEEN   Squamous Epithelial / LPF 0-5 0 - 5   Mucus PRESENT    Hyaline Casts, UA PRESENT   Protein / creatinine ratio, urine     Status: Abnormal   Collection Time: 01/24/19  8:21 PM  Result Value Ref Range   Creatinine, Urine 178.20 mg/dL   Total Protein, Urine 263 mg/dL   Protein Creatinine Ratio 1.48 (H) 0.00 - 0.15 mg/mg[Cre]  CBC     Status: Abnormal   Collection Time: 01/24/19 10:16 PM  Result Value Ref Range   WBC 12.1 (H) 4.0 - 10.5 K/uL   RBC 3.90 3.87 - 5.11 MIL/uL   Hemoglobin 11.3 (L) 12.0 - 15.0 g/dL   HCT 34.3 (L) 56.8 - 61.6 %   MCV 89.0 80.0 - 100.0 fL   MCH 29.0 26.0 - 34.0 pg   MCHC 32.6 30.0 - 36.0 g/dL   RDW 83.7 29.0 - 21.1 %   Platelets 219 150 - 400 K/uL   nRBC 0.0 0.0 - 0.2 %  Comprehensive metabolic panel     Status: Abnormal   Collection Time: 01/24/19 10:16 PM  Result Value Ref Range   Sodium 132 (L) 135 - 145 mmol/L   Potassium 4.2 3.5 - 5.1 mmol/L   Chloride 105 98 - 111 mmol/L   CO2 21 (L) 22 - 32 mmol/L   Glucose, Bld 104 (H) 70 - 99 mg/dL   BUN 11 6 - 20 mg/dL   Creatinine, Ser 1.55 0.44 - 1.00 mg/dL   Calcium 9.1 8.9 - 20.8 mg/dL   Total Protein 5.1 (L) 6.5 - 8.1 g/dL   Albumin 2.6 (L) 3.5 - 5.0 g/dL   AST 20 15 - 41 U/L   ALT 15 0 - 44 U/L   Alkaline Phosphatase 149 (H) 38 - 126 U/L   Total Bilirubin 0.3 0.3 - 1.2 mg/dL   GFR calc non Af Amer >60 >60 mL/min   GFR calc Af Amer >60 >60 mL/min   Anion gap 6 5 - 15      Imaging:  No results found.  MAU Course/MDM:  Orders Placed This Encounter  Procedures  . Urinalysis, Routine w reflex microscopic  . CBC  . Comprehensive metabolic panel  . Protein / creatinine ratio, urine  . Discharge patient    Meds ordered this encounter  Medications  .  butalbital-acetaminophen-caffeine (FIORICET, ESGIC) 50-325-40 MG per tablet 2 tablet     NST reviewed and reactive BP elevated, some normal BPs, no severe range Pt with h/a completely resolved with PO Fioricet x 2 PEC labs with elevated PC ratio of 1.48, creatinine 0.73    Consult Dr Vergie Living with presentation, exam findings and test results.  With CHTN hx and complete resolution of h/a, no evidence of superimposed preeclampsia today. No severe features with no h/a after medications. Pt to continue close outpatient monitoring. Return to MAU for emergencies Pt discharge with strict preeclampsia precautions.    Assessment: 1. Chronic hypertension in obstetric context in third trimester   2. Kidney disease in pregnancy, third trimester   3. Intrauterine growth restriction (IUGR) affecting care of mother, third trimester, single or unspecified fetus   4. Headache in pregnancy, antepartum, third trimester     Plan: Discharge home Labor precautions and fetal kick counts Follow-up Information    Gynecology, [facility] Follow up.   Specialty:  Obstetrics and Gynecology Why:  As scheduled. Contact information: [address] [place] [address] [phone]        [facility] Follow up.   Why:  For signs of labor or emergencies. Contact information: [address] [place] [address] [phone]         Allergies as of 01/25/2019   No Known Allergies     Medication List    TAKE these medications   aspirin EC 81 MG tablet Take 81 mg by mouth daily.   INTEGRA PO Take 1 tablet by mouth daily.   labetalol 200 MG tablet Commonly known as:  NORMODYNE   levothyroxine 125 MCG tablet Commonly known as:  SYNTHROID, LEVOTHROID Take 125 mcg by mouth daily before breakfast.   prenatal multivitamin Tabs tablet Take 1 tablet by mouth daily at 12 noon.   SUMAtriptan 50 MG tablet Commonly known as:   IMITREX Take 1 tablet (50 mg total) by mouth as directed. May repeat in 2 hours if headache persists or recurs. Do not exceed 200 mg in 24 hours.   valACYclovir 500 MG tablet Commonly known as:  Justice Deeds Certified Nurse-Midwife 01/25/2019 4:08 AM

## 2019-01-28 ENCOUNTER — Encounter (HOSPITAL_COMMUNITY): Payer: Self-pay | Admitting: *Deleted

## 2019-01-28 ENCOUNTER — Other Ambulatory Visit: Payer: Self-pay

## 2019-01-28 ENCOUNTER — Inpatient Hospital Stay (HOSPITAL_COMMUNITY)
Admission: AD | Admit: 2019-01-28 | Discharge: 2019-01-28 | Disposition: A | Discharge status: Home / Self Care | Payer: Medicaid Other | Attending: Obstetrics and Gynecology | Admitting: Obstetrics and Gynecology

## 2019-01-28 DIAGNOSIS — Z7982 Long term (current) use of aspirin: Secondary | ICD-10-CM | POA: Insufficient documentation

## 2019-01-28 DIAGNOSIS — O26899 Other specified pregnancy related conditions, unspecified trimester: Secondary | ICD-10-CM

## 2019-01-28 DIAGNOSIS — Z3A34 34 weeks gestation of pregnancy: Secondary | ICD-10-CM | POA: Insufficient documentation

## 2019-01-28 DIAGNOSIS — O10013 Pre-existing essential hypertension complicating pregnancy, third trimester: Secondary | ICD-10-CM | POA: Insufficient documentation

## 2019-01-28 DIAGNOSIS — O26893 Other specified pregnancy related conditions, third trimester: Secondary | ICD-10-CM | POA: Insufficient documentation

## 2019-01-28 DIAGNOSIS — R109 Unspecified abdominal pain: Secondary | ICD-10-CM

## 2019-01-28 DIAGNOSIS — Z3689 Encounter for other specified antenatal screening: Secondary | ICD-10-CM

## 2019-01-28 DIAGNOSIS — O365930 Maternal care for other known or suspected poor fetal growth, third trimester, not applicable or unspecified: Secondary | ICD-10-CM | POA: Diagnosis not present

## 2019-01-28 LAB — URINALYSIS, ROUTINE W REFLEX MICROSCOPIC
Bilirubin Urine: NEGATIVE
Glucose, UA: NEGATIVE mg/dL
Hgb urine dipstick: NEGATIVE
Ketones, ur: NEGATIVE mg/dL
Nitrite: NEGATIVE
Protein, ur: 100 mg/dL — AB
Specific Gravity, Urine: 1.012 (ref 1.005–1.030)
pH: 7 (ref 5.0–8.0)

## 2019-01-28 NOTE — MAU Provider Note (Signed)
History     CSN: [id number]  Arrival date and time: 01/28/19 1014   First Provider Initiated Contact with Patient 01/28/19 1056      Chief Complaint  Patient presents with  . Contractions   G3P0020 @34 .6 wks presenting with abd pain. Sx started this am. Happened twice. Describes as sharp and intermittent, lasts about 2 minutes. She is unsure if it is ctx. No recent falls, pushing, pulling, or lifting. Denies VB and LOF. Reports +FM. Denies urinary sx. No GI sx. Her pregnancy is complicated by Lupus, CHTN, IUGR, and kidney dz.   OB History    Gravida  3   Para      Term      Preterm      AB  2   Living        SAB      TAB      Ectopic      Multiple      Live Births              Past Medical History:  Diagnosis Date  . Chronic kidney disease   . Hypertension   . Lupus (HCC)   . Lupus (HCC)   . Migraines     Past Surgical History:  Procedure Laterality Date  . TONSILLECTOMY      Family History  Problem Relation Age of Onset  . Uterine cancer Mother     Social History   Tobacco Use  . Smoking status: Never Smoker  . Smokeless tobacco: Never Used  Substance Use Topics  . Alcohol use: No  . Drug use: No    Allergies: No Known Allergies  No medications prior to admission.    Review of Systems  Gastrointestinal: Positive for abdominal pain. Negative for constipation and diarrhea.  Genitourinary: Negative for dysuria, hematuria, vaginal bleeding and vaginal discharge.   Physical Exam   Blood pressure 118/75, pulse 73, temperature 98 F (36.7 C), temperature source Oral, resp. rate 18, height 5\' 1"  (1.549 m), weight 85.5 kg, SpO2 99 %. Patient Vitals for the past 24 hrs:  BP Temp Temp src Pulse Resp SpO2 Height Weight  01/28/19 1127 - 98 F (36.7 C) Oral - 18 - - -  01/28/19 1124 - - - - - 99 % - -  01/28/19 1115 118/75 - - 73 - - - -  01/28/19 1100 120/78 - - 81 - 100 % - -  01/28/19 1050 - - - - - 100 % - -  01/28/19 1037 (!)  134/91 98.1 F (36.7 C) Oral 84 20 100 % - -  01/28/19 1026 - - - - - - 5\' 1"  (1.549 m) 85.5 kg   Physical Exam  Nursing note and vitals reviewed. Constitutional: She is oriented to person, place, and time. She appears well-developed and well-nourished. No distress.  HENT:  Head: Normocephalic and atraumatic.  Neck: Normal range of motion.  Cardiovascular: Normal rate.  Respiratory: Effort normal. No respiratory distress.  GI: Soft. She exhibits no distension. There is no abdominal tenderness.  gravid  Genitourinary:    Genitourinary Comments: SVE closed/thick   Musculoskeletal: Normal range of motion.  Neurological: She is alert and oriented to person, place, and time.  Skin: Skin is warm and dry.  Psychiatric: She has a normal mood and affect.  EFM: 140 bpm, mod variability, + accels, no decels Toco: irritability  Results for orders placed or performed during the hospital encounter of 01/28/19 (from the past 24 hour(s))  Urinalysis, Routine w reflex microscopic     Status: Abnormal   Collection Time: 01/28/19 10:47 AM  Result Value Ref Range   Color, Urine YELLOW YELLOW   APPearance HAZY (A) CLEAR   Specific Gravity, Urine 1.012 1.005 - 1.030   pH 7.0 5.0 - 8.0   Glucose, UA NEGATIVE NEGATIVE mg/dL   Hgb urine dipstick NEGATIVE NEGATIVE   Bilirubin Urine NEGATIVE NEGATIVE   Ketones, ur NEGATIVE NEGATIVE mg/dL   Protein, ur 592 (A) NEGATIVE mg/dL   Nitrite NEGATIVE NEGATIVE   Leukocytes,Ua MODERATE (A) NEGATIVE   WBC, UA 0-5 0 - 5 WBC/hpf   Bacteria, UA FEW (A) NONE SEEN   Squamous Epithelial / LPF 11-20 0 - 5    MAU Course  Procedures  MDM No evidence of acute abdominal process or PTL. Pain likely MSK or BH ctx. Stable for discharge home.   Assessment and Plan   1. [redacted] weeks gestation of pregnancy   2. NST (non-stress test) reactive   3. Abdominal pain affecting pregnancy    Discharge home Follow up with [facility] next week as scheduled PTL  precautions  Allergies as of 01/28/2019   No Known Allergies     Medication List    TAKE these medications   aspirin EC 81 MG tablet Take 81 mg by mouth daily.   INTEGRA PO Take 1 tablet by mouth daily.   labetalol 200 MG tablet Commonly known as:  NORMODYNE   levothyroxine 125 MCG tablet Commonly known as:  SYNTHROID, LEVOTHROID Take 125 mcg by mouth daily before breakfast.   prenatal multivitamin Tabs tablet Take 1 tablet by mouth daily at 12 noon.   SUMAtriptan 50 MG tablet Commonly known as:  IMITREX Take 1 tablet (50 mg total) by mouth as directed. May repeat in 2 hours if headache persists or recurs. Do not exceed 200 mg in 24 hours.   valACYclovir 500 MG tablet Commonly known as:  Donnamae Jude, CNM 01/28/2019, 11:52 AM

## 2019-01-28 NOTE — Discharge Instructions (Signed)
Braxton Hicks Contractions Contractions of the uterus can occur throughout pregnancy, but they are not always a sign that you are in labor. You may have practice contractions called Braxton Hicks contractions. These false labor contractions are sometimes confused with true labor. What are Braxton Hicks contractions? Braxton Hicks contractions are tightening movements that occur in the muscles of the uterus before labor. Unlike true labor contractions, these contractions do not result in opening (dilation) and thinning of the cervix. Toward the end of pregnancy (32-34 weeks), Braxton Hicks contractions can happen more often and may become stronger. These contractions are sometimes difficult to tell apart from true labor because they can be very uncomfortable. You should not feel embarrassed if you go to the hospital with false labor. Sometimes, the only way to tell if you are in true labor is for your health care provider to look for changes in the cervix. The health care provider will do a physical exam and may monitor your contractions. If you are not in true labor, the exam should show that your cervix is not dilating and your water has not broken. If there are no other health problems associated with your pregnancy, it is completely safe for you to be sent home with false labor. You may continue to have Braxton Hicks contractions until you go into true labor. How to tell the difference between true labor and false labor True labor  Contractions last 30-70 seconds.  Contractions become very regular.  Discomfort is usually felt in the top of the uterus, and it spreads to the lower abdomen and low back.  Contractions do not go away with walking.  Contractions usually become more intense and increase in frequency.  The cervix dilates and gets thinner. False labor  Contractions are usually shorter and not as strong as true labor contractions.  Contractions are usually irregular.  Contractions  are often felt in the front of the lower abdomen and in the groin.  Contractions may go away when you walk around or change positions while lying down.  Contractions get weaker and are shorter-lasting as time goes on.  The cervix usually does not dilate or become thin. Follow these instructions at home:   Take over-the-counter and prescription medicines only as told by your health care provider.  Keep up with your usual exercises and follow other instructions from your health care provider.  Eat and drink lightly if you think you are going into labor.  If Braxton Hicks contractions are making you uncomfortable: ? Change your position from lying down or resting to walking, or change from walking to resting. ? Sit and rest in a tub of warm water. ? Drink enough fluid to keep your urine pale yellow. Dehydration may cause these contractions. ? Do slow and deep breathing several times an hour.  Keep all follow-up prenatal visits as told by your health care provider. This is important. Contact a health care provider if:  You have a fever.  You have continuous pain in your abdomen. Get help right away if:  Your contractions become stronger, more regular, and closer together.  You have fluid leaking or gushing from your vagina.  You pass blood-tinged mucus (bloody show).  You have bleeding from your vagina.  You have low back pain that you never had before.  You feel your baby's head pushing down and causing pelvic pressure.  Your baby is not moving inside you as much as it used to. Summary  Contractions that occur before labor are   called Braxton Hicks contractions, false labor, or practice contractions.  Braxton Hicks contractions are usually shorter, weaker, farther apart, and less regular than true labor contractions. True labor contractions usually become progressively stronger and regular, and they become more frequent.  Manage discomfort from Braxton Hicks contractions  by changing position, resting in a warm bath, drinking plenty of water, or practicing deep breathing. This information is not intended to replace advice given to you by your health care provider. Make sure you discuss any questions you have with your health care provider. Document Released: 03/28/2017 Document Revised: 08/27/2017 Document Reviewed: 03/28/2017 Elsevier Interactive Patient Education  2019 [employer]  

## 2019-01-28 NOTE — MAU Note (Signed)
Presents with c/o ctxs that began @ 0900 this morning.  Denies LOF or VB.  Reports +FM.

## 2019-02-03 ENCOUNTER — Inpatient Hospital Stay (HOSPITAL_COMMUNITY)
Admission: AD | Admit: 2019-02-03 | Discharge: 2019-02-03 | Disposition: A | Discharge status: Home / Self Care | Payer: Medicaid Other | Source: Ambulatory Visit | Attending: Obstetrics and Gynecology | Admitting: Obstetrics and Gynecology

## 2019-02-03 ENCOUNTER — Encounter (HOSPITAL_COMMUNITY): Payer: Self-pay | Admitting: *Deleted

## 2019-02-03 ENCOUNTER — Other Ambulatory Visit: Payer: Self-pay

## 2019-02-03 ENCOUNTER — Inpatient Hospital Stay (HOSPITAL_BASED_OUTPATIENT_CLINIC_OR_DEPARTMENT_OTHER): Payer: Medicaid Other

## 2019-02-03 DIAGNOSIS — I129 Hypertensive chronic kidney disease with stage 1 through stage 4 chronic kidney disease, or unspecified chronic kidney disease: Secondary | ICD-10-CM | POA: Diagnosis not present

## 2019-02-03 DIAGNOSIS — Z3A35 35 weeks gestation of pregnancy: Secondary | ICD-10-CM

## 2019-02-03 DIAGNOSIS — O26833 Pregnancy related renal disease, third trimester: Secondary | ICD-10-CM | POA: Insufficient documentation

## 2019-02-03 DIAGNOSIS — N189 Chronic kidney disease, unspecified: Secondary | ICD-10-CM | POA: Diagnosis not present

## 2019-02-03 DIAGNOSIS — Z7982 Long term (current) use of aspirin: Secondary | ICD-10-CM | POA: Insufficient documentation

## 2019-02-03 DIAGNOSIS — O9989 Other specified diseases and conditions complicating pregnancy, childbirth and the puerperium: Secondary | ICD-10-CM | POA: Insufficient documentation

## 2019-02-03 DIAGNOSIS — M329 Systemic lupus erythematosus, unspecified: Secondary | ICD-10-CM | POA: Insufficient documentation

## 2019-02-03 DIAGNOSIS — O288 Other abnormal findings on antenatal screening of mother: Secondary | ICD-10-CM | POA: Diagnosis not present

## 2019-02-03 DIAGNOSIS — Z3689 Encounter for other specified antenatal screening: Secondary | ICD-10-CM

## 2019-02-03 DIAGNOSIS — Z79899 Other long term (current) drug therapy: Secondary | ICD-10-CM | POA: Insufficient documentation

## 2019-02-03 DIAGNOSIS — O365990 Maternal care for other known or suspected poor fetal growth, unspecified trimester, not applicable or unspecified: Secondary | ICD-10-CM

## 2019-02-03 LAB — OB RESULTS CONSOLE GBS: GBS: POSITIVE

## 2019-02-03 MED ORDER — BETAMETHASONE SOD PHOS & ACET 6 (3-3) MG/ML IJ SUSP
12.0000 mg | INTRAMUSCULAR | Status: DC
  Administered 2019-02-03: 12 mg via INTRAMUSCULAR
  Filled 2019-02-03: qty 2

## 2019-02-03 NOTE — Discharge Instructions (Signed)
Biophysical Profile A biophysical profile is a non-invasive test that may be done during pregnancy to check that your developing baby (fetus) and your placenta are healthy. Your health care provider may recommend a biophysical profile if your pregnancy is at a higher risk for certain problems. A biophysical profile is usually done during the last 3 months of pregnancy (third trimester). A biophysical profile combines two tests to check the health of your baby. In one test, you will have a device strapped to your belly to measure your baby's heart rate. The other test involves using sound waves and a computer (ultrasound) to create an image of your baby inside your womb (uterus). Together, these tests tell your health care provider about the overall health of your baby. Tell a health care provider about:  Any allergies you have.  All medicines you are taking, including vitamins, herbs, eye drops, creams, and over-the-counter medicines.  Any medical conditions you have.  Any concerns you have about your pregnancy.  Any symptoms such as abdominal pain or contractions, nausea or vomiting, vaginal bleeding, leaking of amniotic fluid, decreased fetal movements, fever or infection, increased swelling, headaches, or visual disturbances.  How often you feel your baby move. What are the risks? There are no risks to you or your baby from a biophysical profile. What happens before the procedure? Ask your health care provider how to prepare.  You may need to drink fluids so that you have a full bladder for your ultrasound.  You may also need to eat before you arrive for the test. That makes your baby more active. What happens during the procedure?      You will lie on your back on an exam table.  Your blood pressure may be monitored during the procedure.  A belt will be placed around your belly. The belt has a sensor to measure your baby's heart rate.  You may have to wear another belt and  sensor to measure any muscle movements (contractions) in your uterus. During the ultrasound, a health care provider or technician will gently roll a handheld device (transducer) over your belly. This device sends signals to a computer that creates images of your baby.  Five areas of your baby's health and development will be checked during the biophysical profile: ? Heart rate. ? Breathing. ? Movement. ? Active muscle movement (muscle tone). ? The amount of fluid in your uterus (amniotic fluid). The procedure may vary among health care providers and hospitals. What happens after the procedure?  Your health care provider will discuss your results with you. The results of a biophysical profile are scored in a range of 0 to 10. Each area that is evaluated is given a score of 0 or 2 points. If you get a score of 6 or less, you may need further testing, or your baby may need to be delivered early. A score of 8 to 10 with normal amniotic fluid levels is considered normal.  Unless you need additional testing, you can go home right after the procedure and resume your normal activities. Summary  A biophysical profile is a non-invasive test that may be done during pregnancy to check that your developing baby (fetus) and your placenta are healthy.  A biophysical profile combines two tests: A test to measure your baby's heart rate and an ultrasound test to create an image of your baby in the womb.  Tell your health care provider about any concerns you have about your pregnancy or any pregnancy-related symptoms.    If you get a score of 6 or less, you may need further testing, or your baby may need to be delivered early. A score of 8 to 10 with normal amniotic fluid levels is considered normal. This information is not intended to replace advice given to you by your health care provider. Make sure you discuss any questions you have with your health care provider. Document Released: 11/02/2002 Document  Revised: 08/06/2017 Document Reviewed: 12/14/2016 Elsevier Interactive Patient Education  2019 Elsevier Inc.  

## 2019-02-03 NOTE — MAU Provider Note (Signed)
History     CSN: [id number]  Arrival date and time: 02/03/19 1617   First Provider Initiated Contact with Patient 02/03/19 1648      Chief Complaint  Patient presents with  . baby measuring small   HPI Vanessa Collier is a 28 y.o. G3P0020 at [redacted]w[redacted]d who presents to MAU for evaluation after non-reactive NST in clinic. She also reports that she previously had difficulty detecting fetal movement but feels this problem has resolved upon arrival in MAU. She denies vaginal bleeding, leaking of fluid, decreased fetal movement, fever, falls, or recent illness.    OB History    Gravida  3   Para      Term      Preterm      AB  2   Living        SAB      TAB  2   Ectopic      Multiple      Live Births              Past Medical History:  Diagnosis Date  . Chronic kidney disease   . Hypertension   . Lupus (HCC)   . Lupus (HCC)   . Migraines     Past Surgical History:  Procedure Laterality Date  . TONSILLECTOMY      Family History  Problem Relation Age of Onset  . Uterine cancer Mother     Social History   Tobacco Use  . Smoking status: Never Smoker  . Smokeless tobacco: Never Used  Substance Use Topics  . Alcohol use: No  . Drug use: No    Allergies: No Known Allergies  Medications Prior to Admission  Medication Sig Dispense Refill Last Dose  . aspirin EC 81 MG tablet Take 81 mg by mouth daily.   02/03/2019 at Unknown time  . Fe Fum-FePoly-Vit C-Vit B3 (INTEGRA PO) Take 1 tablet by mouth daily.   02/03/2019 at Unknown time  . labetalol (NORMODYNE) 200 MG tablet   5 02/03/2019 at Unknown time  . levothyroxine (SYNTHROID, LEVOTHROID) 125 MCG tablet Take 125 mcg by mouth daily before breakfast.   02/03/2019 at Unknown time  . Prenatal Vit-Fe Fumarate-FA (PRENATAL MULTIVITAMIN) TABS tablet Take 1 tablet by mouth daily at 12 noon.   02/03/2019 at Unknown time  . valACYclovir (VALTREX) 500 MG tablet   1 02/03/2019 at Unknown time  . SUMAtriptan (IMITREX) 50  MG tablet Take 1 tablet (50 mg total) by mouth as directed. May repeat in 2 hours if headache persists or recurs. Do not exceed 200 mg in 24 hours. 5 tablet 0 Unknown at Unknown time    Review of Systems  Constitutional: Negative for fever.  Gastrointestinal: Negative for abdominal pain.  Genitourinary: Negative for difficulty urinating, vaginal bleeding, vaginal discharge and vaginal pain.  Musculoskeletal: Negative for back pain.  Neurological: Negative for headaches.  All other systems reviewed and are negative.  Physical Exam   Blood pressure 129/89, pulse 77, temperature 98.2 F (36.8 C), temperature source Oral, resp. rate 18, weight 86.6 kg, SpO2 100 %.  Physical Exam  Nursing note and vitals reviewed. Constitutional: She is oriented to person, place, and time. She appears well-developed and well-nourished.  Cardiovascular: Normal rate.  Respiratory: Effort normal.  GI: She exhibits no distension. There is no abdominal tenderness. There is no rebound and no guarding.  Gravid  Musculoskeletal: Normal range of motion.  Neurological: She is alert and oriented to person, place, and time.  Skin: Skin is warm and dry.  Psychiatric: She has a normal mood and affect. Her behavior is normal. Judgment and thought content normal.   MAU Course/MDM  Procedures  --Discussed with Dr. Dion Body prior to patient arrival.  --Dr. Dion Body notified of BPP 8/8 and plan for discharge at 1740  Orders Placed This Encounter  Procedures  . [place] MFM FETAL BPP WO NON STRESS    Small for dates, getting weekly NSTs and BPPs, non-reactive in clinic today    Standing Status:   Standing    Number of Occurrences:   1    Order Specific Question:   Symptom/Reason for Exam    Answer:   Non-reactive NST (non-stress test) [[id number]]   Meds ordered this encounter  Medications  . betamethasone acetate-betamethasone sodium phosphate (CELESTONE) injection 12 mg   Assessment and Plan  --28 y.o. G3P0020 at [redacted]w[redacted]d   --Category I tracing prior to BPP --BPP 8/8 --BMZ 1 of 2 given in MAU --Discharge home in stable condition  F/U: Patient to return to MAU tomorrow after 5om for BMZ 2 of 2  Calvert Cantor, CNM 02/03/2019, 5:51 PM

## 2019-02-03 NOTE — MAU Note (Signed)
Dr sent me over for an [place] and a steroid shot.   Baby is measuring small. Denies pain, bleeding or leaking.

## 2019-02-04 ENCOUNTER — Inpatient Hospital Stay (HOSPITAL_COMMUNITY)
Admission: AD | Admit: 2019-02-04 | Discharge: 2019-02-04 | Disposition: A | Discharge status: Home / Self Care | Payer: Medicaid Other | Attending: Obstetrics & Gynecology | Admitting: Obstetrics & Gynecology

## 2019-02-04 DIAGNOSIS — O365930 Maternal care for other known or suspected poor fetal growth, third trimester, not applicable or unspecified: Secondary | ICD-10-CM | POA: Insufficient documentation

## 2019-02-04 DIAGNOSIS — Z3A35 35 weeks gestation of pregnancy: Secondary | ICD-10-CM | POA: Diagnosis not present

## 2019-02-04 MED ORDER — BETAMETHASONE SOD PHOS & ACET 6 (3-3) MG/ML IJ SUSP
12.0000 mg | Freq: Once | INTRAMUSCULAR | Status: AC
  Administered 2019-02-04: 12 mg via INTRAMUSCULAR
  Filled 2019-02-04: qty 2

## 2019-02-04 NOTE — MAU Note (Signed)
Discussed pt's intermittent pain with Carloyn Jaeger CNM & Sabas Sous CNM.  Pt doesn't need to be evaluated @ this time, no pain presently.  Pt informed to return to MAU if pain becomes worse, has bleeding or LOF - verbalizes understanding.

## 2019-02-04 NOTE — MAU Note (Addendum)
Pt here for second BMZ shot.  C/O lower abdominal pain "on sides" that started this morning, is intermittent.  No pain @ this time. Denies bleeding or LOF. Reports good fetal movement.

## 2019-02-12 ENCOUNTER — Other Ambulatory Visit: Payer: Self-pay | Admitting: Obstetrics and Gynecology

## 2019-02-13 ENCOUNTER — Encounter (HOSPITAL_COMMUNITY): Payer: Self-pay

## 2019-02-13 ENCOUNTER — Inpatient Hospital Stay (HOSPITAL_COMMUNITY)
Admission: RE | Admit: 2019-02-13 | Discharge: 2019-02-17 | DRG: 787 | Disposition: A | Discharge status: Home / Self Care | Payer: Medicaid Other | Attending: Obstetrics and Gynecology | Admitting: Obstetrics and Gynecology

## 2019-02-13 DIAGNOSIS — O114 Pre-existing hypertension with pre-eclampsia, complicating childbirth: Secondary | ICD-10-CM | POA: Diagnosis not present

## 2019-02-13 DIAGNOSIS — D649 Anemia, unspecified: Secondary | ICD-10-CM | POA: Diagnosis present

## 2019-02-13 DIAGNOSIS — A6000 Herpesviral infection of urogenital system, unspecified: Secondary | ICD-10-CM | POA: Diagnosis not present

## 2019-02-13 DIAGNOSIS — O1002 Pre-existing essential hypertension complicating childbirth: Secondary | ICD-10-CM | POA: Diagnosis present

## 2019-02-13 DIAGNOSIS — O99824 Streptococcus B carrier state complicating childbirth: Secondary | ICD-10-CM | POA: Diagnosis present

## 2019-02-13 DIAGNOSIS — O9832 Other infections with a predominantly sexual mode of transmission complicating childbirth: Secondary | ICD-10-CM | POA: Diagnosis not present

## 2019-02-13 DIAGNOSIS — O09623 Supervision of young multigravida, third trimester: Secondary | ICD-10-CM

## 2019-02-13 DIAGNOSIS — M3214 Glomerular disease in systemic lupus erythematosus: Secondary | ICD-10-CM | POA: Diagnosis present

## 2019-02-13 DIAGNOSIS — O365930 Maternal care for other known or suspected poor fetal growth, third trimester, not applicable or unspecified: Secondary | ICD-10-CM | POA: Diagnosis present

## 2019-02-13 DIAGNOSIS — G43009 Migraine without aura, not intractable, without status migrainosus: Secondary | ICD-10-CM | POA: Insufficient documentation

## 2019-02-13 DIAGNOSIS — O9902 Anemia complicating childbirth: Secondary | ICD-10-CM | POA: Diagnosis present

## 2019-02-13 DIAGNOSIS — O99284 Endocrine, nutritional and metabolic diseases complicating childbirth: Secondary | ICD-10-CM | POA: Diagnosis not present

## 2019-02-13 DIAGNOSIS — Z98891 History of uterine scar from previous surgery: Secondary | ICD-10-CM

## 2019-02-13 DIAGNOSIS — O1413 Severe pre-eclampsia, third trimester: Secondary | ICD-10-CM | POA: Diagnosis present

## 2019-02-13 DIAGNOSIS — E039 Hypothyroidism, unspecified: Secondary | ICD-10-CM | POA: Diagnosis not present

## 2019-02-13 DIAGNOSIS — O26833 Pregnancy related renal disease, third trimester: Secondary | ICD-10-CM | POA: Diagnosis present

## 2019-02-13 DIAGNOSIS — Z3A37 37 weeks gestation of pregnancy: Secondary | ICD-10-CM | POA: Diagnosis not present

## 2019-02-13 DIAGNOSIS — I10 Essential (primary) hypertension: Secondary | ICD-10-CM | POA: Insufficient documentation

## 2019-02-13 DIAGNOSIS — O1490 Unspecified pre-eclampsia, unspecified trimester: Secondary | ICD-10-CM | POA: Diagnosis present

## 2019-02-13 LAB — COMPREHENSIVE METABOLIC PANEL
ALT: 29 U/L (ref 0–44)
ALT: 40 U/L (ref 0–44)
AST: 34 U/L (ref 15–41)
AST: 47 U/L — ABNORMAL HIGH (ref 15–41)
Albumin: 2.3 g/dL — ABNORMAL LOW (ref 3.5–5.0)
Albumin: 2.2 g/dL — ABNORMAL LOW (ref 3.5–5.0)
Alkaline Phosphatase: 129 U/L — ABNORMAL HIGH (ref 38–126)
Alkaline Phosphatase: 130 U/L — ABNORMAL HIGH (ref 38–126)
Anion gap: 6 (ref 5–15)
Anion gap: 6 (ref 5–15)
BUN: 7 mg/dL (ref 6–20)
BUN: 7 mg/dL (ref 6–20)
CO2: 19 mmol/L — ABNORMAL LOW (ref 22–32)
CO2: 24 mmol/L (ref 22–32)
Calcium: 9 mg/dL (ref 8.9–10.3)
Calcium: 7.9 mg/dL — ABNORMAL LOW (ref 8.9–10.3)
Chloride: 110 mmol/L (ref 98–111)
Chloride: 104 mmol/L (ref 98–111)
Creatinine, Ser: 0.58 mg/dL (ref 0.44–1.00)
Creatinine, Ser: 0.56 mg/dL (ref 0.44–1.00)
GFR calc Af Amer: >60 mL/min (ref >60)
GFR calc Af Amer: >60 mL/min (ref >60)
GFR calc non Af Amer: >60 mL/min (ref >60)
GFR calc non Af Amer: >60 mL/min (ref >60)
Glucose, Bld: 90 mg/dL (ref 70–99)
Glucose, Bld: 92 mg/dL (ref 70–99)
Potassium: 4.2 mmol/L (ref 3.5–5.1)
Potassium: 4.1 mmol/L (ref 3.5–5.1)
Sodium: 135 mmol/L (ref 135–145)
Sodium: 134 mmol/L — ABNORMAL LOW (ref 135–145)
Total Bilirubin: 0.5 mg/dL (ref 0.3–1.2)
Total Bilirubin: 0.5 mg/dL (ref 0.3–1.2)
Total Protein: 4.7 g/dL — ABNORMAL LOW (ref 6.5–8.1)
Total Protein: 5.5 g/dL — ABNORMAL LOW (ref 6.5–8.1)

## 2019-02-13 LAB — CBC
HCT: 35.7 % — ABNORMAL LOW (ref 36.0–46.0)
HCT: 36.9 % (ref 36.0–46.0)
Hemoglobin: 11.6 g/dL — ABNORMAL LOW (ref 12.0–15.0)
Hemoglobin: 12.2 g/dL (ref 12.0–15.0)
MCH: 29 pg (ref 26.0–34.0)
MCH: 29.2 pg (ref 26.0–34.0)
MCHC: 32.5 g/dL (ref 30.0–36.0)
MCHC: 33.1 g/dL (ref 30.0–36.0)
MCV: 89.3 fL (ref 80.0–100.0)
MCV: 88.3 fL (ref 80.0–100.0)
Platelets: 228 10*3/uL (ref 150–400)
Platelets: 229 10*3/uL (ref 150–400)
RBC: 4 MIL/uL (ref 3.87–5.11)
RBC: 4.18 MIL/uL (ref 3.87–5.11)
RDW: 13.6 % (ref 11.5–15.5)
RDW: 13.6 % (ref 11.5–15.5)
WBC: 13.9 10*3/uL — ABNORMAL HIGH (ref 4.0–10.5)
WBC: 13.2 10*3/uL — ABNORMAL HIGH (ref 4.0–10.5)
nRBC: 0 % (ref 0.0–0.2)
nRBC: 0 % (ref 0.0–0.2)

## 2019-02-13 LAB — ABO/RH: ABO/RH(D): O POS

## 2019-02-13 LAB — TYPE AND SCREEN
ABO/RH(D): O POS
Antibody Screen: NEGATIVE

## 2019-02-13 LAB — MAGNESIUM: Magnesium: 5.2 mg/dL — ABNORMAL HIGH (ref 1.7–2.4)

## 2019-02-13 LAB — PROTEIN / CREATININE RATIO, URINE
Creatinine, Urine: 17.26 mg/dL
Protein Creatinine Ratio: 12.75 mg/mg{Cre} — ABNORMAL HIGH (ref 0.00–0.15)
Total Protein, Urine: 220 mg/dL

## 2019-02-13 LAB — URIC ACID: Uric Acid, Serum: 8 mg/dL — ABNORMAL HIGH (ref 2.5–7.1)

## 2019-02-13 LAB — RPR: RPR Ser Ql: NONREACTIVE

## 2019-02-13 LAB — LACTATE DEHYDROGENASE: LDH: 207 U/L — ABNORMAL HIGH (ref 98–192)

## 2019-02-13 MED ORDER — LABETALOL HCL 5 MG/ML IV SOLN
20.0000 mg | INTRAVENOUS | Status: DC | PRN
  Administered 2019-02-13: 20 mg via INTRAVENOUS
  Filled 2019-02-13: qty 4

## 2019-02-13 MED ORDER — MISOPROSTOL 25 MCG QUARTER TABLET
25.0000 ug | ORAL_TABLET | ORAL | Status: DC | PRN
  Administered 2019-02-13 (×3): 25 ug via VAGINAL
  Filled 2019-02-13 (×3): qty 1

## 2019-02-13 MED ORDER — LABETALOL HCL 200 MG PO TABS
200.0000 mg | ORAL_TABLET | Freq: Three times a day (TID) | ORAL | Status: DC
  Administered 2019-02-13 (×3): 200 mg via ORAL
  Filled 2019-02-13 (×3): qty 1

## 2019-02-13 MED ORDER — OXYTOCIN 40 UNITS IN NORMAL SALINE INFUSION - SIMPLE MED
2.5000 [IU]/h | INTRAVENOUS | Status: DC
  Administered 2019-02-14: 2.5 [IU]/h via INTRAVENOUS
  Filled 2019-02-13: qty 1000

## 2019-02-13 MED ORDER — LEVOTHYROXINE SODIUM 150 MCG PO TABS
150.0000 ug | ORAL_TABLET | Freq: Every day | ORAL | Status: DC
  Administered 2019-02-13 – 2019-02-15 (×3): 150 ug via ORAL
  Filled 2019-02-13 (×6): qty 1

## 2019-02-13 MED ORDER — LACTATED RINGERS IV SOLN
500.0000 mL | INTRAVENOUS | Status: DC | PRN
  Administered 2019-02-14: 500 mL via INTRAVENOUS

## 2019-02-13 MED ORDER — OXYTOCIN BOLUS FROM INFUSION: 500.0000 mL | Freq: Once | INTRAVENOUS | Status: DC

## 2019-02-13 MED ORDER — LABETALOL HCL 5 MG/ML IV SOLN
40.0000 mg | INTRAVENOUS | Status: DC | PRN
  Administered 2019-02-13: 40 mg via INTRAVENOUS
  Filled 2019-02-13: qty 8

## 2019-02-13 MED ORDER — LABETALOL HCL 5 MG/ML IV SOLN: 80.0000 mg | INTRAVENOUS | Status: DC | PRN

## 2019-02-13 MED ORDER — ONDANSETRON HCL 4 MG/2ML IJ SOLN
4.0000 mg | Freq: Four times a day (QID) | INTRAMUSCULAR | Status: DC | PRN
  Administered 2019-02-14: 4 mg via INTRAVENOUS

## 2019-02-13 MED ORDER — OXYCODONE-ACETAMINOPHEN 5-325 MG PO TABS: 1.0000 | ORAL_TABLET | ORAL | Status: DC | PRN

## 2019-02-13 MED ORDER — ACETAMINOPHEN 325 MG PO TABS
650.0000 mg | ORAL_TABLET | ORAL | Status: DC | PRN
  Administered 2019-02-13 (×2): 650 mg via ORAL
  Filled 2019-02-13 (×2): qty 2

## 2019-02-13 MED ORDER — SOD CITRATE-CITRIC ACID 500-334 MG/5ML PO SOLN
30.0000 mL | ORAL | Status: DC | PRN
  Administered 2019-02-14: 30 mL via ORAL
  Filled 2019-02-13 (×2): qty 15

## 2019-02-13 MED ORDER — TERBUTALINE SULFATE 1 MG/ML IJ SOLN: 0.2500 mg | Freq: Once | INTRAMUSCULAR | Status: DC | PRN

## 2019-02-13 MED ORDER — LEVOTHYROXINE SODIUM 125 MCG PO TABS
125.0000 ug | ORAL_TABLET | Freq: Every day | ORAL | Status: DC
  Filled 2019-02-13: qty 1

## 2019-02-13 MED ORDER — HYDRALAZINE HCL 20 MG/ML IJ SOLN: 10.0000 mg | INTRAMUSCULAR | Status: DC | PRN

## 2019-02-13 MED ORDER — BUTORPHANOL TARTRATE 1 MG/ML IJ SOLN
1.0000 mg | INTRAMUSCULAR | Status: DC | PRN
  Administered 2019-02-13: 1 mg via INTRAVENOUS
  Filled 2019-02-13: qty 1

## 2019-02-13 MED ORDER — FLEET ENEMA 7-19 GM/118ML RE ENEM: 1.0000 | ENEMA | RECTAL | Status: DC | PRN

## 2019-02-13 MED ORDER — OXYCODONE-ACETAMINOPHEN 5-325 MG PO TABS: 2.0000 | ORAL_TABLET | ORAL | Status: DC | PRN

## 2019-02-13 MED ORDER — SODIUM CHLORIDE 0.9 % IV SOLN
5.0000 10*6.[IU] | Freq: Once | INTRAVENOUS | Status: AC
  Administered 2019-02-13: 5 10*6.[IU] via INTRAVENOUS
  Filled 2019-02-13: qty 5

## 2019-02-13 MED ORDER — MAGNESIUM SULFATE 40 G IN LACTATED RINGERS - SIMPLE
2.0000 g/h | INTRAVENOUS | Status: DC
  Administered 2019-02-13: 2 g/h via INTRAVENOUS
  Filled 2019-02-13 (×3): qty 500

## 2019-02-13 MED ORDER — OXYTOCIN 40 UNITS IN NORMAL SALINE INFUSION - SIMPLE MED: 1.0000 m[IU]/h | INTRAVENOUS | Status: DC

## 2019-02-13 MED ORDER — LEVOTHYROXINE SODIUM 150 MCG PO TABS: 150.0000 ug | ORAL_TABLET | Freq: Every day | ORAL | Status: DC

## 2019-02-13 MED ORDER — LIDOCAINE HCL (PF) 1 % IJ SOLN: 30.0000 mL | INTRAMUSCULAR | Status: DC | PRN

## 2019-02-13 MED ORDER — OXYTOCIN 40 UNITS IN NORMAL SALINE INFUSION - SIMPLE MED
1.0000 m[IU]/min | INTRAVENOUS | Status: DC
  Administered 2019-02-13: 1 m[IU]/min via INTRAVENOUS

## 2019-02-13 MED ORDER — LABETALOL HCL 200 MG PO TABS: 200.0000 mg | ORAL_TABLET | Freq: Two times a day (BID) | ORAL | Status: DC

## 2019-02-13 MED ORDER — PENICILLIN G 3 MILLION UNITS IVPB - SIMPLE MED
3.0000 10*6.[IU] | INTRAVENOUS | Status: DC
  Administered 2019-02-13 – 2019-02-14 (×6): 3 10*6.[IU] via INTRAVENOUS
  Filled 2019-02-13 (×7): qty 100

## 2019-02-13 MED ORDER — LACTATED RINGERS IV SOLN
INTRAVENOUS | Status: DC
  Administered 2019-02-13 – 2019-02-14 (×4): via INTRAVENOUS

## 2019-02-13 MED ORDER — MAGNESIUM SULFATE BOLUS VIA INFUSION
4.0000 g | Freq: Once | INTRAVENOUS | Status: AC
  Administered 2019-02-13: 4 g via INTRAVENOUS
  Filled 2019-02-13: qty 500

## 2019-02-13 NOTE — Progress Notes (Addendum)
Pt started to have pain with contractions.  Requesting pain medication.  Denies headache.  BP (!) 160/105   Pulse 78   Temp 97.8 F (36.6 C) (Oral)   Resp 17   Ht 5\' 1"  (1.549 m)   Wt 89.4 kg   BMI 37.24 kg/m  I/O last 3 completed shifts: In: 1130.4 [P.O.:120; I.V.:660.4; IV Piggyback:350] Out: 1525 [Urine:1525] Total I/O In: 1520 [P.O.:270; I.V.:1150; IV Piggyback:100] Out: 1950 [Urine:1950]   Gen:  Pt uncomfortable with contractions. Ext:  SCDs. Cervix deferred.  No significant bleeding.  Cat I tracing. Contractions not tracing well.  Q3 clinically.  CBC Latest Ref Rng & Units 02/13/2019 02/13/2019 01/24/2019  WBC 4.0 - 10.5 K/uL 13.2(H) 13.9(H) 12.1(H)  Hemoglobin 12.0 - 15.0 g/dL 48.8 11.6(L) 11.3(L)  Hematocrit 36.0 - 46.0 % 36.9 35.7(L) 34.7(L)  Platelets 150 - 400 K/uL 229 228 219   Other preeclampsia labs pending.  IUP @ 37 1/7 weeks Preeclampsia with severe features by BP on Magnesium.             F/u Preeclampsia labs with Magnesium level.             Strict I/Os.  UOP adequate.             No signs of Magnesium toxicity. CHTN BP improved on Labetalol 200 mg TID Lupus Nephropathy.             Creatinine normal.  F/u CMP. Symmetric IUGR  BMZ at 34 week. Cat I and II at times. Overall reassuring S/p Cytotec x 3.  Foley bulb placed.  Consider starting Pitocin if renal function normal.  Stadol IV now for pain. DVT prophylaxis-SCDs. [facility] assuming care.  Pt and mother aware.

## 2019-02-13 NOTE — Progress Notes (Signed)
Pt is comfortable BP (!) 156/98   Pulse 69   Temp 97.8 F (36.6 C) (Oral)   Resp 17   Ht 5\' 1"  (1.549 m)   Wt 89.4 kg   BMI 37.24 kg/m   140 decrease variability toco no contractions 4/50/-2 Foley bulb  Recent Results (from the past 2160 hour(s))  Urinalysis, Routine w reflex microscopic     Status: Abnormal   Collection Time: 12/13/18  6:07 PM  Result Value Ref Range   Color, Urine YELLOW YELLOW   APPearance CLEAR CLEAR   Specific Gravity, Urine 1.023 1.005 - 1.030   pH 6.0 5.0 - 8.0   Glucose, UA NEGATIVE NEGATIVE mg/dL   Hgb urine dipstick NEGATIVE NEGATIVE   Bilirubin Urine NEGATIVE NEGATIVE   Ketones, ur NEGATIVE NEGATIVE mg/dL   Protein, ur 158 (A) NEGATIVE mg/dL   Nitrite NEGATIVE NEGATIVE   Leukocytes, UA NEGATIVE NEGATIVE   RBC / HPF 6-10 0 - 5 RBC/hpf   WBC, UA 0-5 0 - 5 WBC/hpf   Bacteria, UA NONE SEEN NONE SEEN   Squamous Epithelial / LPF 0-5 0 - 5   Mucus PRESENT     Comment: Performed at [facility], [address], [place], [place] [address]  Protein / creatinine ratio, urine     Status: Abnormal   Collection Time: 12/13/18  6:07 PM  Result Value Ref Range   Creatinine, Urine 189.00 mg/dL   Total Protein, Urine 113 mg/dL    Comment: NO NORMAL RANGE ESTABLISHED FOR THIS TEST   Protein Creatinine Ratio 0.60 (H) 0.00 - 0.15 mg/mg[Cre]    Comment: Performed at [facility], [address] [place], [place] [address]  CBC     Status: Abnormal   Collection Time: 12/13/18  6:51 PM  Result Value Ref Range   WBC 11.5 (H) 4.0 - 10.5 K/uL   RBC 3.70 (L) 3.87 - 5.11 MIL/uL   Hemoglobin 11.0 (L) 12.0 - 15.0 g/dL   HCT 81.1 (L) 03.1 - 59.4 %   MCV 89.7 80.0 - 100.0 fL   MCH 29.7 26.0 - 34.0 pg   MCHC 33.1 30.0 - 36.0 g/dL   RDW 58.5 92.9 - 24.4 %   Platelets 236 150 - 400 K/uL   nRBC 0.0 0.0 - 0.2 %    Comment: Performed at [facility], [address] [place], [place] [address]  Comprehensive metabolic panel     Status: Abnormal   Collection  Time: 12/13/18  6:51 PM  Result Value Ref Range   Sodium 133 (L) 135 - 145 mmol/L   Potassium 3.7 3.5 - 5.1 mmol/L   Chloride 107 98 - 111 mmol/L   CO2 20 (L) 22 - 32 mmol/L   Glucose, Bld 88 70 - 99 mg/dL   BUN 9 6 - 20 mg/dL   Creatinine, Ser 8.17 0.44 - 1.00 mg/dL   Calcium 9.0 8.9 - 71.1 mg/dL   Total Protein 5.9 (L) 6.5 - 8.1 g/dL   Albumin 3.0 (L) 3.5 - 5.0 g/dL   AST 19 15 - 41 U/L   ALT 16 0 - 44 U/L   Alkaline Phosphatase 88 38 - 126 U/L   Total Bilirubin 0.4 0.3 - 1.2 mg/dL   GFR calc non Af Amer >60 >60 mL/min   GFR calc Af Amer >60 >60 mL/min   Anion gap 6 5 - 15    Comment: Performed at [facility], [address] [place], [place] [address]  Urinalysis, Routine w reflex  microscopic     Status: Abnormal   Collection Time: 01/24/19  8:21 PM  Result Value Ref Range   Color, Urine YELLOW YELLOW   APPearance CLEAR CLEAR   Specific Gravity, Urine 1.020 1.005 - 1.030   pH 6.0 5.0 - 8.0   Glucose, UA NEGATIVE NEGATIVE mg/dL   Hgb urine dipstick NEGATIVE NEGATIVE   Bilirubin Urine NEGATIVE NEGATIVE   Ketones, ur NEGATIVE NEGATIVE mg/dL   Protein, ur 782 (A) NEGATIVE mg/dL   Nitrite NEGATIVE NEGATIVE   Leukocytes,Ua NEGATIVE NEGATIVE   RBC / HPF 0-5 0 - 5 RBC/hpf   WBC, UA 0-5 0 - 5 WBC/hpf   Bacteria, UA NONE SEEN NONE SEEN   Squamous Epithelial / LPF 0-5 0 - 5   Mucus PRESENT    Hyaline Casts, UA PRESENT     Comment: Performed at [facility], [address], [place], [place] [address]  Protein / creatinine ratio, urine     Status: Abnormal   Collection Time: 01/24/19  8:21 PM  Result Value Ref Range   Creatinine, Urine 178.20 mg/dL   Total Protein, Urine 263 mg/dL    Comment: NO NORMAL RANGE ESTABLISHED FOR THIS TEST RESULTS CONFIRMED BY MANUAL DILUTION    Protein Creatinine Ratio 1.48 (H) 0.00 - 0.15 mg/mg[Cre]    Comment: Performed at [facility], [address] [place], [place] [address]  CBC     Status: Abnormal   Collection Time:  01/24/19 10:16 PM  Result Value Ref Range   WBC 12.1 (H) 4.0 - 10.5 K/uL   RBC 3.90 3.87 - 5.11 MIL/uL   Hemoglobin 11.3 (L) 12.0 - 15.0 g/dL   HCT 15.4 (L) 00.8 - 67.6 %   MCV 89.0 80.0 - 100.0 fL   MCH 29.0 26.0 - 34.0 pg   MCHC 32.6 30.0 - 36.0 g/dL   RDW 19.5 09.3 - 26.7 %   Platelets 219 150 - 400 K/uL   nRBC 0.0 0.0 - 0.2 %    Comment: Performed at [facility], [address] [place], [place] [address]  Comprehensive metabolic panel     Status: Abnormal   Collection Time: 01/24/19 10:16 PM  Result Value Ref Range   Sodium 132 (L) 135 - 145 mmol/L   Potassium 4.2 3.5 - 5.1 mmol/L   Chloride 105 98 - 111 mmol/L   CO2 21 (L) 22 - 32 mmol/L   Glucose, Bld 104 (H) 70 - 99 mg/dL   BUN 11 6 - 20 mg/dL   Creatinine, Ser 0.99 0.44 - 1.00 mg/dL   Calcium 9.1 8.9 - 83.3 mg/dL   Total Protein 5.1 (L) 6.5 - 8.1 g/dL   Albumin 2.6 (L) 3.5 - 5.0 g/dL   AST 20 15 - 41 U/L   ALT 15 0 - 44 U/L   Alkaline Phosphatase 149 (H) 38 - 126 U/L   Total Bilirubin 0.3 0.3 - 1.2 mg/dL   GFR calc non Af Amer >60 >60 mL/min   GFR calc Af Amer >60 >60 mL/min   Anion gap 6 5 - 15    Comment: Performed at [facility], [address] [place], [place] [address]  Urinalysis, Routine w reflex microscopic     Status: Abnormal   Collection Time: 01/28/19 10:47 AM  Result Value Ref Range   Color, Urine YELLOW YELLOW   APPearance HAZY (A) CLEAR   Specific Gravity, Urine 1.012 1.005 - 1.030   pH 7.0  5.0 - 8.0   Glucose, UA NEGATIVE NEGATIVE mg/dL   Hgb urine dipstick NEGATIVE NEGATIVE   Bilirubin Urine NEGATIVE NEGATIVE   Ketones, ur NEGATIVE NEGATIVE mg/dL   Protein, ur 213 (A) NEGATIVE mg/dL   Nitrite NEGATIVE NEGATIVE   Leukocytes,Ua MODERATE (A) NEGATIVE   WBC, UA 0-5 0 - 5 WBC/hpf   Bacteria, UA FEW (A) NONE SEEN   Squamous Epithelial / LPF 11-20 0 - 5    Comment: Performed at [facility], [address], [place], [place] [address]  CBC     Status: Abnormal   Collection  Time: 02/13/19  1:35 AM  Result Value Ref Range   WBC 13.9 (H) 4.0 - 10.5 K/uL   RBC 4.00 3.87 - 5.11 MIL/uL   Hemoglobin 11.6 (L) 12.0 - 15.0 g/dL   HCT 84.6 (L) 96.2 - 95.2 %   MCV 89.3 80.0 - 100.0 fL   MCH 29.0 26.0 - 34.0 pg   MCHC 32.5 30.0 - 36.0 g/dL   RDW 84.1 32.4 - 40.1 %   Platelets 228 150 - 400 K/uL   nRBC 0.0 0.0 - 0.2 %    Comment: Performed at [facility], [address] [place], [place] [address]  RPR     Status: None   Collection Time: 02/13/19  1:35 AM  Result Value Ref Range   RPR Ser Ql Non Reactive Non Reactive    Comment: (NOTE) Performed At: [facility] [address] [place], [place] [id number] Jolene Schimke MD QQ:[id number]   Type and screen     Status: None   Collection Time: 02/13/19  1:35 AM  Result Value Ref Range   ABO/RH(D) O POS    Antibody Screen NEG    Sample Expiration      02/16/2019 Performed at [facility], [address], [place], [place] [address]   Comprehensive metabolic panel     Status: Abnormal   Collection Time: 02/13/19  1:35 AM  Result Value Ref Range   Sodium 135 135 - 145 mmol/L   Potassium 4.2 3.5 - 5.1 mmol/L   Chloride 110 98 - 111 mmol/L   CO2 19 (L) 22 - 32 mmol/L   Glucose, Bld 90 70 - 99 mg/dL   BUN 7 6 - 20 mg/dL   Creatinine, Ser 1.88 0.44 - 1.00 mg/dL   Calcium 9.0 8.9 - 41.6 mg/dL   Total Protein 4.7 (L) 6.5 - 8.1 g/dL   Albumin 2.3 (L) 3.5 - 5.0 g/dL   AST 34 15 - 41 U/L   ALT 29 0 - 44 U/L   Alkaline Phosphatase 129 (H) 38 - 126 U/L   Total Bilirubin 0.5 0.3 - 1.2 mg/dL   GFR calc non Af Amer >60 >60 mL/min   GFR calc Af Amer >60 >60 mL/min   Anion gap 6 5 - 15    Comment: Performed at [facility], [address], [place], [place] [address]  ABO/Rh     Status: None   Collection Time: 02/13/19  1:35 AM  Result Value Ref Range   ABO/RH(D) O POS    No rh immune globuloin      NOT A RH IMMUNE GLOBULIN CANDIDATE, PT RH POSITIVE Performed at [facility],  [address], [place], [place] [address]   Protein / creatinine ratio, urine     Status: Abnormal   Collection Time: 02/13/19  2:19 AM  Result Value Ref Range  Creatinine, Urine 17.26 mg/dL   Total Protein, Urine 220 mg/dL    Comment: RESULTS CONFIRMED BY MANUAL DILUTION NO NORMAL RANGE ESTABLISHED FOR THIS TEST    Protein Creatinine Ratio 12.75 (H) 0.00 - 0.15 mg/mg[Cre]    Comment: Performed at [facility], [address] [place], [place] [address]  CBC     Status: Abnormal   Collection Time: 02/13/19  4:09 PM  Result Value Ref Range   WBC 13.2 (H) 4.0 - 10.5 K/uL   RBC 4.18 3.87 - 5.11 MIL/uL   Hemoglobin 12.2 12.0 - 15.0 g/dL   HCT 60.436.9 54.036.0 - 98.146.0 %   MCV 88.3 80.0 - 100.0 fL   MCH 29.2 26.0 - 34.0 pg   MCHC 33.1 30.0 - 36.0 g/dL   RDW 19.113.6 47.811.5 - 29.515.5 %   Platelets 229 150 - 400 K/uL   nRBC 0.0 0.0 - 0.2 %    Comment: Performed at [facility], [address] [place], [place] [address]  Comprehensive metabolic panel     Status: Abnormal   Collection Time: 02/13/19  4:09 PM  Result Value Ref Range   Sodium 134 (L) 135 - 145 mmol/L   Potassium 4.1 3.5 - 5.1 mmol/L   Chloride 104 98 - 111 mmol/L   CO2 24 22 - 32 mmol/L   Glucose, Bld 92 70 - 99 mg/dL   BUN 7 6 - 20 mg/dL   Creatinine, Ser 8.650.56 0.44 - 1.00 mg/dL   Calcium 7.9 (L) 8.9 - 10.3 mg/dL   Total Protein 5.5 (L) 6.5 - 8.1 g/dL   Albumin 2.2 (L) 3.5 - 5.0 g/dL   AST 47 (H) 15 - 41 U/L   ALT 40 0 - 44 U/L   Alkaline Phosphatase 130 (H) 38 - 126 U/L   Total Bilirubin 0.5 0.3 - 1.2 mg/dL   GFR calc non Af Amer >60 >60 mL/min   GFR calc Af Amer >60 >60 mL/min   Anion gap 6 5 - 15    Comment: Performed at [facility], [address] [place], [place] [address]  Uric acid     Status: Abnormal   Collection Time: 02/13/19  4:09 PM  Result Value Ref Range   Uric Acid, Serum 8.0 (H) 2.5 - 7.1 mg/dL    Comment: Performed at [facility], [address] [place], [place] [address]  Lactate  dehydrogenase     Status: Abnormal   Collection Time: 02/13/19  4:09 PM  Result Value Ref Range   LDH 207 (H) 98 - 192 U/L    Comment: Performed at [facility], [address] [place], [place] [address]  Magnesium     Status: Abnormal   Collection Time: 02/13/19  4:09 PM  Result Value Ref Range   Magnesium 5.2 (H) 1.7 - 2.4 mg/dL    Comment: Performed at [facility], [address] [place], [place] [address]  IUGR AT 37 weeks Cat 2 however pt on magnesium and had stadol Start pitocin ARom clear bp elevated however stable Mag level is stable AST and ALT are slightly elevated.   Epidural PRN anticipate SVD

## 2019-02-13 NOTE — Progress Notes (Signed)
Pt feeling better.  Her mother is en route from [place], [place]. Pt had a headache within the last 2 hours.  Relieved with Tylenol. Denies visual changes or abdominal pain  Temp:  [97.5 F (36.4 C)-98.2 F (36.8 C)] 97.5 F (36.4 C) (03/20 1226) Pulse Rate:  [60-87] 79 (03/20 1318) Resp:  [16-18] 17 (03/20 1306) BP: (122-188)/(78-108) 149/96 (03/20 1318) Weight:  [89.4 kg] 89.4 kg (03/20 0401)   Gen:  NAD,  Abd:  No upper abdominal pain Ext:  No calf tenderness Neuro:  DTRs not illicited. Cervix:  1/30/-3  Decreased variability at times, rare variable deceleration  SSE:  No herpetic lesion.  Pt has an epidermal cyst on left labia @ 3 o'clock, nontender, no discharge.  No lesions on perineum where she normally has her outbreaks.  No cervical or vaginal lesions.  Foley bulb prepped with betadine.  Inserted through cervix.  60 ml infused.  Pt tolerated it well.  Blood filled catheter but no active bleeding from vagina.  CBC Latest Ref Rng & Units 02/13/2019 01/24/2019 12/13/2018  WBC 4.0 - 10.5 K/uL 13.9(H) 12.1(H) 11.5(H)  Hemoglobin 12.0 - 15.0 g/dL 11.6(L) 11.3(L) 11.0(L)  Hematocrit 36.0 - 46.0 % 35.7(L) 34.7(L) 33.2(L)  Platelets 150 - 400 K/uL 228 219 236    CMP     Component Value Date/Time   NA 135 02/13/2019 0135   K 4.2 02/13/2019 0135   CL 110 02/13/2019 0135   CO2 19 (L) 02/13/2019 0135   GLUCOSE 90 02/13/2019 0135   BUN 7 02/13/2019 0135   CREATININE 0.58 02/13/2019 0135   CALCIUM 9.0 02/13/2019 0135   PROT 4.7 (L) 02/13/2019 0135   ALBUMIN 2.3 (L) 02/13/2019 0135   AST 34 02/13/2019 0135   ALT 29 02/13/2019 0135   ALKPHOS 129 (H) 02/13/2019 0135   BILITOT 0.5 02/13/2019 0135   GFRNONAA >60 02/13/2019 0135   GFRAA >60 02/13/2019 0135   PCR 12,000  A/P  IUP @ 37 1/7 weeks Preeclampsia with severe features by BP on Magnesium.  Check Preeclampsia labs with Magnesium level.  Strict I/Os.  No signs of Magnesium toxicity. CHTN BP improved on Labetalol 200 mg  TID Lupus Nephropathy.  Creatinine normal. Cat I and II at times. Overall reassuring S/p Cytotec x 3.  Foley bulb placed. DVT prophylaxis-Place TED hose or SCDs, pt preference.

## 2019-02-13 NOTE — Progress Notes (Signed)
Labor Progress Note  Per HPI: Vanessa Collier is a 28 y.o. female G4 P0030 @ 37 1/7 weeks  for IOL on 02/13/19. Pregnancy has been complicated by Lupus Nephropathy, CHTN-well controlled, Symmetric IUGR, high normal dopplers, Hypothyroidism recently increased form 125 to 150 mcg of Synthroid.  Pt also has a h/o of HSV and has been on Valtrex.  Denies recent outbreaks. Pt has been co-managed with [facility] MFM and Dr. Signe Colt, Nephrology.  Subjective: Pt resting in bed with mother at bedside in NAD< pt is c/o a HA and requested tylenol. Pt denies vision changes or RUQ pain. Pt continues on magnesium and pitocin, just finished penicillin for GBS positive.  Patient Active Problem List   Diagnosis Date Noted  . Hypothyroidism 02/13/2019  . Essential (primary) hypertension 02/13/2019  . Migraine without aura and without status migrainosus, not intractable 02/13/2019  . Anemia 02/13/2019  . High risk pregnancy in young multigravida, third trimester 02/13/2019  . Preeclampsia, severe, third trimester 02/13/2019  . Lupus nephritis (HCC) 09/10/2018  . Supervision of high-risk pregnancy 09/10/2018   Objective: BP 132/80   Pulse 71   Temp 97.9 F (36.6 C)   Resp (P) 16   Ht 5\' 1"  (1.549 m)   Wt 89.4 kg   BMI 37.24 kg/m  I/O last 3 completed shifts: In: 3355.8 [P.O.:520; I.V.:2185.8; IV Piggyback:650] Out: 3475 [Urine:3475] Total I/O In: 252.7 [I.V.:252.7] Out: 975 [Urine:975] NST: FHR baseline 135 bpm, Variability: minimal , Accelerations:not present, Decelerations:  Absent= Cat 2/NR CTX:  irregular, every 2-5 minutes, lasting 40-60 seoconds Uterus gravid, soft non tender, moderate to palpate with contractions.   Deferred check, last check was @ 1900 SVE:  Dilation: 4 Effacement (%): 50 Station: -2, -1 Exam by:: Dr. Normand Sloop Pitocin at 4 mUn/min  Assessment:  Vanessa Collier, 28 y.o., G3P0020, with an IUP @ [redacted]w[redacted]d, presented for IOL on 02/13/19. Pregnancy has been complicated by Lupus  Nephropathy recent PCR of 4 then elevated to 12, CHTN-well controlled, had x1 SR BP on admission and required x1 dose of IV labetalol, dx with preeclampsia with SF on magnesium, Symmetric IUGR, EFW , high normal dopplers, Hypothyroidism recently increased form 125 to 150 mcg of Synthroid.  Pt also has a h/o of HSV and has been on Valtrex.  Denies recent outbreaks. Pt has been co-managed with [facility] MFM and Dr. Signe Colt, Nephrology. Pt stable, comfortable feeling cxt, little, able to sleep, c/o HA now requesting tylenol, no other s/sx. Patient Active Problem List   Diagnosis Date Noted  . Hypothyroidism 02/13/2019  . Essential (primary) hypertension 02/13/2019  . Migraine without aura and without status migrainosus, not intractable 02/13/2019  . Anemia 02/13/2019  . High risk pregnancy in young multigravida, third trimester 02/13/2019  . Preeclampsia, severe, third trimester 02/13/2019  . Lupus nephritis (HCC) 09/10/2018  . Supervision of high-risk pregnancy 09/10/2018   NICHD: Category 2  Membranes:  AROM @ 1900 on 3/20 x 4hrs, no s/s of infection  Induction:    Cytotec x 3@ 0211, 0617, 1121 on 3/20  Foley Bulb: inserted  I @1330  &O at 1830  Pitocin - 4  Pain management:               IV pain management: x stadol @ 1700 on 3/20  Nitrous: PRN             Epidural placement:  PRN  GBS positive  Abx: 5 doses of penicillin  HA: Tylenol  CHTN with superimposed PreE  with SF: On labetalol 200mg  TID, BP 132/80, HA, no other s/sx, stable I&O >82mls  Hypothyroidism: Stable on synthroid  HSV+: No lesion, on valtrex.   Plan: Continue labor plan Continuous monitoring Rest Frequent position changes to facilitate fetal rotation and descent. Will reassess with cervical exam  As indicated with feeling pressure to decrease infections or earlier if necessary Creatinine: WNL continue on pitocin per protocol.  HSV: Continue valtrex Hypothyroidism: Continue synthroid daily  CHTN with superimposed PreE with SF: Monitor BP, resolution of HA with tylenol, I&O, continue on magnesium until 24 hours PP, continue on labetalol 200mg  TID>  Continue pitocin per protocol Anticipate labor progression and vaginal delivery.   Md Dillard aware of strip.  [place], NP-C, CNM, MSN 02/13/2019. 10:55 PM

## 2019-02-13 NOTE — Progress Notes (Signed)
Pt tearful because she anticipated that her mother would be allowed in as a support person.  Instead FOB is only one allowed and he left.  Pt and RN report eyes are puffy because of crying.  RN states they were not like on admission. Pt denies headaches, visual changes, upper abdominal pain.  Temp:  [98.2 F (36.8 C)] 98.2 F (36.8 C) (03/20 0223) Pulse Rate:  [60-87] 81 (03/20 0600) Resp:  [16-17] 16 (03/20 0600) BP: (122-188)/(86-108) 145/100 (03/20 0600) Weight:  [89.4 kg] 89.4 kg (03/20 0401)   Gen:  NAD, tearful Abd:  No upper abdominal pain Ext:  No calf tenderness Neuro:  DTRs not illicited.  Decreased variability, occasional contractions  CBC Latest Ref Rng & Units 02/13/2019 01/24/2019 12/13/2018  WBC 4.0 - 10.5 K/uL 13.9(H) 12.1(H) 11.5(H)  Hemoglobin 12.0 - 15.0 g/dL 11.6(L) 11.3(L) 11.0(L)  Hematocrit 36.0 - 46.0 % 35.7(L) 34.7(L) 33.2(L)  Platelets 150 - 400 K/uL 228 219 236    CMP     Component Value Date/Time   NA 135 02/13/2019 0135   K 4.2 02/13/2019 0135   CL 110 02/13/2019 0135   CO2 19 (L) 02/13/2019 0135   GLUCOSE 90 02/13/2019 0135   BUN 7 02/13/2019 0135   CREATININE 0.58 02/13/2019 0135   CALCIUM 9.0 02/13/2019 0135   PROT 4.7 (L) 02/13/2019 0135   ALBUMIN 2.3 (L) 02/13/2019 0135   AST 34 02/13/2019 0135   ALT 29 02/13/2019 0135   ALKPHOS 129 (H) 02/13/2019 0135   BILITOT 0.5 02/13/2019 0135   GFRNONAA >60 02/13/2019 0135   GFRAA >60 02/13/2019 0135   PCR 12,000  A/P  IUP @ 37 1/7 weeks Preeclampsia with severe features by BP on Magnesium. CHTN previously on Labetalol 200 mg BID.  Last dose taken at 2200 yesterday.  Start Labetalol 200 mg TID for renal protection. Lupus Nephropathy.  Creatinine normal. Cat I and II at times.  Magnesium is further reducing variability.  Mother reports fetal movement.  Monitor closely. S/p Cytotec x 2.  Consider Foley bulb.

## 2019-02-13 NOTE — H&P (Addendum)
Vanessa Collier is a 28 y.o. female G4 P0030 @ 37 1/7 weeks  for IOL on 02/13/19. Pregnancy has been complicated by Lupus Nephropathy, CHTN-well controlled, Symmetric IUGR, high normal dopplers, Hypothyroidism recently increased form 125 to 150 mcg of Synthroid.  Pt also has a h/o of HSV and has been on Valtrex.  Denies recent outbreaks. Pt has been co-managed with [facility] MFM and Dr. Signe Colt, Nephrology. Pt has denied headaches, visual changes, abdominal pain, contractions, bleeding.  OB History    Gravida  3   Para      Term      Preterm      AB  2   Living        SAB      TAB  2   Ectopic      Multiple      Live Births             Past Medical History:  Diagnosis Date  . Chronic kidney disease   . Hypertension   . Lupus (HCC)   . Lupus (HCC)   . Migraines    Past Surgical History:  Procedure Laterality Date  . TONSILLECTOMY     Family History: family history includes Uterine cancer in her mother. Social History:  reports that she has never smoked. She has never used smokeless tobacco. She reports that she does not drink alcohol or use drugs.     Maternal Diabetes: No Genetic Screening: Normal Maternal Ultrasounds/Referrals: Abnormal:  Findings:   IUGR Fetal Ultrasounds or other Referrals:  None Maternal Substance Abuse:  No Significant Maternal Medications:  Meds include: Syntroid Other: Labetalol 200 mg BID, Valtrex Significant Maternal Lab Results:  Lab values include: Group B Strep positive Other Comments:  Mother has Lupus Nephropathy and hypothyroidism  Review of Systems  Gastrointestinal: Negative for abdominal pain.   Maternal Medical History:  Fetal activity: Perceived fetal activity is normal.    Prenatal complications: PIH and IUGR.   Prenatal Complications - Diabetes: none.      There were no vitals taken for this visit. Maternal Exam:  Abdomen: Estimated fetal weight is 01/30/19 EFW 3 lbs 15 oz <10%.   Fetal presentation:  vertex  Introitus: Normal vulva. Pelvis: adequate for delivery.   Cervix: Cervix evaluated by digital exam.     Fetal Exam Fetal Monitor Review: Mode: hand-held doppler probe.   Baseline rate: 150s.  BPP 8/8 on 02/10/19.  AFI =8 cm.     Physical Exam  Constitutional: She is oriented to person, place, and time. She appears well-developed and well-nourished. No distress.  HENT:  Head: Normocephalic and atraumatic.  Eyes: EOM are normal.  Neck: Normal range of motion.  Cardiovascular: Normal rate and regular rhythm.  Respiratory: Effort normal.  GI: Soft. There is no abdominal tenderness.  Genitourinary:    Vulva normal.   Musculoskeletal: Normal range of motion.        General: Edema present. No tenderness.  Neurological: She is alert and oriented to person, place, and time. She has normal reflexes.  Skin: Skin is warm and dry.  Psychiatric: She has a normal mood and affect.    Prenatal labs: ABO, Rh:   Antibody:   Rubella:   RPR:    HBsAg:    HIV:    GBS:     Assessment/Plan: IUP @ 36 4/7 weeeks Symmetric IUGR  High normal dopplers CHTN on Labetalol 200 mg BID Lupus Nephropathy  Recent PCR ~4 Hypothyroidism on Synthroid 150  mcg GBS+  PCN with onset of labor of ROM. HSV no active lesions on Valtrex suppression  IOL with Cytotec.    Reviewed procedure and risks.  Geryl Rankins 02/13/2019, 1:04 AM   On admission patient was noted to have 2 severe range blood pressures. Labetalol protocol was ordered. Patient denies symptoms of preeclampsia and has a preexisting condition which has caused proteinuria throughout pregnancy. I consulted Dr. Mora Appl regarding the severe range blood pressures: We are giving her a diagnosis of preeclampsia with severe features and starting her on magnesium sulfate. Will continue to monitor.   Janeece Riggers 02/13/19 2:05 AM

## 2019-02-13 NOTE — Progress Notes (Signed)
Pt with some pain from contractions BP 135/89   Pulse 87   Temp 97.8 F (36.6 C) (Other (Comment))   Resp 17   Ht 5\' 1"  (1.549 m)   Wt 89.4 kg   BMI 37.24 kg/m  Cat 1 and 2  toco unable to trace cx unchanged IUPC placed.  Pitocin at 4 mu min Neg CST Check labs in the morning Pt stable Anticipate SVD

## 2019-02-14 ENCOUNTER — Other Ambulatory Visit: Payer: Self-pay

## 2019-02-14 ENCOUNTER — Inpatient Hospital Stay (HOSPITAL_COMMUNITY): Payer: Medicaid Other | Admitting: Anesthesiology

## 2019-02-14 ENCOUNTER — Encounter (HOSPITAL_COMMUNITY): Payer: Self-pay | Admitting: *Deleted

## 2019-02-14 ENCOUNTER — Encounter (HOSPITAL_COMMUNITY)
Admission: RE | Disposition: A | Discharge status: Home / Self Care | Payer: Self-pay | Source: Home / Self Care | Attending: Obstetrics and Gynecology

## 2019-02-14 DIAGNOSIS — O1490 Unspecified pre-eclampsia, unspecified trimester: Secondary | ICD-10-CM | POA: Diagnosis present

## 2019-02-14 LAB — COMPREHENSIVE METABOLIC PANEL
ALT: 39 U/L (ref 0–44)
AST: 39 U/L (ref 15–41)
Albumin: 2.1 g/dL — ABNORMAL LOW (ref 3.5–5.0)
Alkaline Phosphatase: 126 U/L (ref 38–126)
Anion gap: 6 (ref 5–15)
BUN: 5 mg/dL — ABNORMAL LOW (ref 6–20)
CO2: 24 mmol/L (ref 22–32)
Calcium: 7 mg/dL — ABNORMAL LOW (ref 8.9–10.3)
Chloride: 101 mmol/L (ref 98–111)
Creatinine, Ser: 0.67 mg/dL (ref 0.44–1.00)
GFR calc Af Amer: >60 mL/min (ref >60)
GFR calc non Af Amer: >60 mL/min (ref >60)
Glucose, Bld: 139 mg/dL — ABNORMAL HIGH (ref 70–99)
Potassium: 4.6 mmol/L (ref 3.5–5.1)
Sodium: 131 mmol/L — ABNORMAL LOW (ref 135–145)
Total Bilirubin: 0.5 mg/dL (ref 0.3–1.2)
Total Protein: 4.8 g/dL — ABNORMAL LOW (ref 6.5–8.1)

## 2019-02-14 LAB — CBC
HCT: 36.2 % (ref 36.0–46.0)
HCT: 32.6 % — ABNORMAL LOW (ref 36.0–46.0)
Hemoglobin: 12.4 g/dL (ref 12.0–15.0)
Hemoglobin: 10.7 g/dL — ABNORMAL LOW (ref 12.0–15.0)
MCH: 30.2 pg (ref 26.0–34.0)
MCH: 30.1 pg (ref 26.0–34.0)
MCHC: 34.3 g/dL (ref 30.0–36.0)
MCHC: 32.8 g/dL (ref 30.0–36.0)
MCV: 88.1 fL (ref 80.0–100.0)
MCV: 91.6 fL (ref 80.0–100.0)
Platelets: 227 10*3/uL (ref 150–400)
Platelets: 209 10*3/uL (ref 150–400)
RBC: 4.11 MIL/uL (ref 3.87–5.11)
RBC: 3.56 MIL/uL — ABNORMAL LOW (ref 3.87–5.11)
RDW: 13.7 % (ref 11.5–15.5)
RDW: 14 % (ref 11.5–15.5)
WBC: 16.1 10*3/uL — ABNORMAL HIGH (ref 4.0–10.5)
WBC: 14.8 10*3/uL — ABNORMAL HIGH (ref 4.0–10.5)
nRBC: 0 % (ref 0.0–0.2)
nRBC: 0 % (ref 0.0–0.2)

## 2019-02-14 LAB — URIC ACID: Uric Acid, Serum: 7.4 mg/dL — ABNORMAL HIGH (ref 2.5–7.1)

## 2019-02-14 LAB — CBC WITH DIFFERENTIAL/PLATELET
Abs Immature Granulocytes: 0.06 10*3/uL (ref 0.00–0.07)
Basophils Absolute: 0 10*3/uL (ref 0.0–0.1)
Basophils Relative: 0 %
Eosinophils Absolute: 0.1 10*3/uL (ref 0.0–0.5)
Eosinophils Relative: 1 %
HCT: 36.2 % (ref 36.0–46.0)
Hemoglobin: 12.1 g/dL (ref 12.0–15.0)
Immature Granulocytes: 0 %
Lymphocytes Relative: 10 %
Lymphs Abs: 1.5 10*3/uL (ref 0.7–4.0)
MCH: 30 pg (ref 26.0–34.0)
MCHC: 33.4 g/dL (ref 30.0–36.0)
MCV: 89.8 fL (ref 80.0–100.0)
Monocytes Absolute: 0.7 10*3/uL (ref 0.1–1.0)
Monocytes Relative: 5 %
Neutro Abs: 12.4 10*3/uL — ABNORMAL HIGH (ref 1.7–7.7)
Neutrophils Relative %: 84 %
Platelets: 226 10*3/uL (ref 150–400)
RBC: 4.03 MIL/uL (ref 3.87–5.11)
RDW: 13.8 % (ref 11.5–15.5)
WBC: 14.8 10*3/uL — ABNORMAL HIGH (ref 4.0–10.5)
nRBC: 0 % (ref 0.0–0.2)

## 2019-02-14 LAB — MAGNESIUM: Magnesium: 5.8 mg/dL — ABNORMAL HIGH (ref 1.7–2.4)

## 2019-02-14 LAB — LACTATE DEHYDROGENASE: LDH: 194 U/L — ABNORMAL HIGH (ref 98–192)

## 2019-02-14 SURGERY — Surgical Case
Anesthesia: Epidural | Site: Abdomen | Wound class: Clean Contaminated

## 2019-02-14 MED ORDER — SENNOSIDES-DOCUSATE SODIUM 8.6-50 MG PO TABS
2.0000 | ORAL_TABLET | ORAL | Status: DC
  Administered 2019-02-15 – 2019-02-17 (×3): 2 via ORAL
  Filled 2019-02-14 (×3): qty 2

## 2019-02-14 MED ORDER — MORPHINE SULFATE (PF) 0.5 MG/ML IJ SOLN
INTRAMUSCULAR | Status: AC
  Filled 2019-02-14: qty 10

## 2019-02-14 MED ORDER — MAGNESIUM SULFATE BOLUS VIA INFUSION
4.0000 g | Freq: Once | INTRAVENOUS | Status: DC
  Filled 2019-02-14: qty 500

## 2019-02-14 MED ORDER — DIBUCAINE 1 % RE OINT: 1.0000 "application " | TOPICAL_OINTMENT | RECTAL | Status: DC | PRN

## 2019-02-14 MED ORDER — MEPERIDINE HCL 25 MG/ML IJ SOLN: 6.2500 mg | INTRAMUSCULAR | Status: DC | PRN

## 2019-02-14 MED ORDER — LACTATED RINGERS IV SOLN
500.0000 mL | Freq: Once | INTRAVENOUS | Status: AC
  Administered 2019-02-14: 500 mL via INTRAVENOUS

## 2019-02-14 MED ORDER — HYDRALAZINE HCL 20 MG/ML IJ SOLN: 10.0000 mg | INTRAMUSCULAR | Status: DC | PRN

## 2019-02-14 MED ORDER — SIMETHICONE 80 MG PO CHEW
80.0000 mg | CHEWABLE_TABLET | ORAL | Status: DC
  Administered 2019-02-15 – 2019-02-17 (×3): 80 mg via ORAL
  Filled 2019-02-14 (×3): qty 1

## 2019-02-14 MED ORDER — INTEGRA 62.5-62.5-40-3 MG PO CAPS: 62.5000 | ORAL_CAPSULE | Freq: Every morning | ORAL | Status: DC

## 2019-02-14 MED ORDER — COCONUT OIL OIL
1.0000 "application " | TOPICAL_OIL | Status: DC | PRN
  Administered 2019-02-15: 1 via TOPICAL

## 2019-02-14 MED ORDER — TETANUS-DIPHTH-ACELL PERTUSSIS 5-2.5-18.5 LF-MCG/0.5 IM SUSP: 0.5000 mL | Freq: Once | INTRAMUSCULAR | Status: DC

## 2019-02-14 MED ORDER — OXYTOCIN 40 UNITS IN NORMAL SALINE INFUSION - SIMPLE MED
INTRAVENOUS | Status: AC
  Filled 2019-02-14: qty 1000

## 2019-02-14 MED ORDER — WITCH HAZEL-GLYCERIN EX PADS: 1.0000 "application " | MEDICATED_PAD | CUTANEOUS | Status: DC | PRN

## 2019-02-14 MED ORDER — SODIUM BICARBONATE 8.4 % IV SOLN
INTRAVENOUS | Status: DC | PRN
  Administered 2019-02-14: 5 mL via EPIDURAL
  Administered 2019-02-14: 3 mL via EPIDURAL

## 2019-02-14 MED ORDER — LABETALOL HCL 200 MG PO TABS
200.0000 mg | ORAL_TABLET | Freq: Two times a day (BID) | ORAL | Status: DC
  Administered 2019-02-14 – 2019-02-16 (×5): 200 mg via ORAL
  Filled 2019-02-14 (×5): qty 1

## 2019-02-14 MED ORDER — SODIUM CHLORIDE (PF) 0.9 % IJ SOLN
INTRAMUSCULAR | Status: DC | PRN
  Administered 2019-02-14: 12 mL/h via EPIDURAL

## 2019-02-14 MED ORDER — LACTATED RINGERS IV SOLN
INTRAVENOUS | Status: DC
  Administered 2019-02-14 – 2019-02-15 (×2): via INTRAVENOUS

## 2019-02-14 MED ORDER — DIPHENHYDRAMINE HCL 25 MG PO CAPS: 25.0000 mg | ORAL_CAPSULE | Freq: Four times a day (QID) | ORAL | Status: DC | PRN

## 2019-02-14 MED ORDER — ENOXAPARIN SODIUM 60 MG/0.6ML ~~LOC~~ SOLN
45.0000 mg | SUBCUTANEOUS | Status: DC
  Administered 2019-02-15 – 2019-02-16 (×2): 45 mg via SUBCUTANEOUS
  Filled 2019-02-14 (×2): qty 0.6

## 2019-02-14 MED ORDER — OXYTOCIN 10 UNIT/ML IJ SOLN: INTRAVENOUS | Status: DC | PRN

## 2019-02-14 MED ORDER — OXYTOCIN 40 UNITS IN NORMAL SALINE INFUSION - SIMPLE MED: 2.5000 [IU]/h | INTRAVENOUS | Status: AC

## 2019-02-14 MED ORDER — LABETALOL HCL 5 MG/ML IV SOLN: 80.0000 mg | INTRAVENOUS | Status: DC | PRN

## 2019-02-14 MED ORDER — PROMETHAZINE HCL 25 MG/ML IJ SOLN: 6.2500 mg | INTRAMUSCULAR | Status: DC | PRN

## 2019-02-14 MED ORDER — LEVOTHYROXINE SODIUM 150 MCG PO TABS
150.0000 ug | ORAL_TABLET | Freq: Every day | ORAL | Status: DC
  Administered 2019-02-16 – 2019-02-17 (×2): 150 ug via ORAL
  Filled 2019-02-14 (×3): qty 1

## 2019-02-14 MED ORDER — CEFAZOLIN SODIUM-DEXTROSE 2-4 GM/100ML-% IV SOLN
INTRAVENOUS | Status: AC
  Filled 2019-02-14: qty 100

## 2019-02-14 MED ORDER — FENTANYL-BUPIVACAINE-NACL 0.5-0.125-0.9 MG/250ML-% EP SOLN
12.0000 mL/h | EPIDURAL | Status: DC | PRN
  Filled 2019-02-14: qty 250

## 2019-02-14 MED ORDER — ONDANSETRON HCL 4 MG/2ML IJ SOLN
INTRAMUSCULAR | Status: AC
  Filled 2019-02-14: qty 2

## 2019-02-14 MED ORDER — PHENYLEPHRINE HCL 10 MG/ML IJ SOLN
INTRAMUSCULAR | Status: DC | PRN
  Administered 2019-02-14 (×2): 80 ug via INTRAVENOUS

## 2019-02-14 MED ORDER — CEFAZOLIN SODIUM-DEXTROSE 2-3 GM-%(50ML) IV SOLR
INTRAVENOUS | Status: DC | PRN
  Administered 2019-02-14: 2 g via INTRAVENOUS

## 2019-02-14 MED ORDER — PRENATAL MULTIVITAMIN CH
1.0000 | ORAL_TABLET | Freq: Every day | ORAL | Status: DC
  Administered 2019-02-15 – 2019-02-16 (×2): 1 via ORAL
  Filled 2019-02-14 (×2): qty 1

## 2019-02-14 MED ORDER — ZOLPIDEM TARTRATE 5 MG PO TABS: 5.0000 mg | ORAL_TABLET | Freq: Every evening | ORAL | Status: DC | PRN

## 2019-02-14 MED ORDER — MAGNESIUM SULFATE 40 G IN LACTATED RINGERS - SIMPLE
2.0000 g/h | INTRAVENOUS | Status: DC
  Administered 2019-02-14 – 2019-02-15 (×2): 2 g/h via INTRAVENOUS

## 2019-02-14 MED ORDER — HYDROCODONE-ACETAMINOPHEN 5-325 MG PO TABS
1.0000 | ORAL_TABLET | ORAL | Status: DC | PRN
  Administered 2019-02-14: 1 via ORAL
  Administered 2019-02-15 – 2019-02-16 (×6): 2 via ORAL
  Filled 2019-02-14: qty 1
  Filled 2019-02-14 (×2): qty 2
  Filled 2019-02-14: qty 1
  Filled 2019-02-14 (×2): qty 2
  Filled 2019-02-14: qty 1
  Filled 2019-02-14 (×2): qty 2

## 2019-02-14 MED ORDER — PHENYLEPHRINE 40 MCG/ML (10ML) SYRINGE FOR IV PUSH (FOR BLOOD PRESSURE SUPPORT): 80.0000 ug | PREFILLED_SYRINGE | INTRAVENOUS | Status: DC | PRN

## 2019-02-14 MED ORDER — PHENYLEPHRINE 40 MCG/ML (10ML) SYRINGE FOR IV PUSH (FOR BLOOD PRESSURE SUPPORT)
PREFILLED_SYRINGE | INTRAVENOUS | Status: AC
  Filled 2019-02-14: qty 10

## 2019-02-14 MED ORDER — LIDOCAINE HCL (PF) 1 % IJ SOLN
INTRAMUSCULAR | Status: DC | PRN
  Administered 2019-02-14: 6 mL via EPIDURAL

## 2019-02-14 MED ORDER — MENTHOL 3 MG MT LOZG: 1.0000 | LOZENGE | OROMUCOSAL | Status: DC | PRN

## 2019-02-14 MED ORDER — SIMETHICONE 80 MG PO CHEW: 80.0000 mg | CHEWABLE_TABLET | ORAL | Status: DC | PRN

## 2019-02-14 MED ORDER — NALBUPHINE HCL 10 MG/ML IJ SOLN: 5.0000 mg | INTRAMUSCULAR | Status: DC | PRN

## 2019-02-14 MED ORDER — EPHEDRINE 5 MG/ML INJ: 10.0000 mg | INTRAVENOUS | Status: DC | PRN

## 2019-02-14 MED ORDER — MIDAZOLAM HCL 2 MG/2ML IJ SOLN: 0.5000 mg | Freq: Once | INTRAMUSCULAR | Status: DC | PRN

## 2019-02-14 MED ORDER — MORPHINE SULFATE (PF) 0.5 MG/ML IJ SOLN
INTRAMUSCULAR | Status: DC | PRN
  Administered 2019-02-14: 2.5 mg via EPIDURAL

## 2019-02-14 MED ORDER — FENTANYL-BUPIVACAINE-NACL 0.5-0.125-0.9 MG/250ML-% EP SOLN: 12.0000 mL/h | EPIDURAL | Status: DC | PRN

## 2019-02-14 MED ORDER — MORPHINE SULFATE (PF) 4 MG/ML IV SOLN: 1.0000 mg | INTRAVENOUS | Status: DC | PRN

## 2019-02-14 MED ORDER — OXYTOCIN 40 UNITS IN NORMAL SALINE INFUSION - SIMPLE MED
INTRAVENOUS | Status: DC | PRN
  Administered 2019-02-14: 40 mL via INTRAVENOUS

## 2019-02-14 MED ORDER — LABETALOL HCL 5 MG/ML IV SOLN: 40.0000 mg | INTRAVENOUS | Status: DC | PRN

## 2019-02-14 MED ORDER — DIPHENHYDRAMINE HCL 50 MG/ML IJ SOLN: 12.5000 mg | INTRAMUSCULAR | Status: DC | PRN

## 2019-02-14 MED ORDER — KETOROLAC TROMETHAMINE 30 MG/ML IJ SOLN: 30.0000 mg | Freq: Once | INTRAMUSCULAR | Status: DC | PRN

## 2019-02-14 MED ORDER — LABETALOL HCL 5 MG/ML IV SOLN: 20.0000 mg | INTRAVENOUS | Status: DC | PRN

## 2019-02-14 MED ORDER — HYDROXYCHLOROQUINE SULFATE 200 MG PO TABS
200.0000 mg | ORAL_TABLET | Freq: Every day | ORAL | Status: DC
  Administered 2019-02-14 – 2019-02-17 (×4): 200 mg via ORAL
  Filled 2019-02-14 (×6): qty 1

## 2019-02-14 MED ORDER — SIMETHICONE 80 MG PO CHEW
80.0000 mg | CHEWABLE_TABLET | Freq: Three times a day (TID) | ORAL | Status: DC
  Administered 2019-02-15 – 2019-02-17 (×6): 80 mg via ORAL
  Filled 2019-02-14 (×6): qty 1

## 2019-02-14 SURGICAL SUPPLY — 35 items
BENZOIN TINCTURE PRP APPL 2/3 (GAUZE/BANDAGES/DRESSINGS) ×2 IMPLANT
CHLORAPREP W/TINT 26ML (MISCELLANEOUS) ×2 IMPLANT
CLAMP CORD UMBIL (MISCELLANEOUS) IMPLANT
CLOTH BEACON ORANGE TIMEOUT ST (SAFETY) ×2 IMPLANT
DRAIN JACKSON PRT FLT 10 (DRAIN) IMPLANT
DRSG OPSITE POSTOP 4X10 (GAUZE/BANDAGES/DRESSINGS) ×2 IMPLANT
ELECT REM PT RETURN 9FT ADLT (ELECTROSURGICAL) ×2
ELECTRODE REM PT RTRN 9FT ADLT (ELECTROSURGICAL) ×1 IMPLANT
EVACUATOR SILICONE 100CC (DRAIN) IMPLANT
EXTRACTOR VACUUM M CUP 4 TUBE (SUCTIONS) IMPLANT
GLOVE BIO SURGEON STRL SZ 6.5 (GLOVE) ×2 IMPLANT
GLOVE BIOGEL PI IND STRL 7.0 (GLOVE) ×2 IMPLANT
GLOVE BIOGEL PI INDICATOR 7.0 (GLOVE) ×2
GOWN STRL REUS W/TWL LRG LVL3 (GOWN DISPOSABLE) ×4 IMPLANT
KIT ABG SYR 3ML LUER SLIP (SYRINGE) IMPLANT
NEEDLE HYPO 25X5/8 SAFETYGLIDE (NEEDLE) IMPLANT
NS IRRIG 1000ML POUR BTL (IV SOLUTION) ×2 IMPLANT
PACK C SECTION WH (CUSTOM PROCEDURE TRAY) ×2 IMPLANT
PAD OB MATERNITY 4.3X12.25 (PERSONAL CARE ITEMS) ×2 IMPLANT
PENCIL SMOKE EVAC W/HOLSTER (ELECTROSURGICAL) ×2 IMPLANT
RTRCTR C-SECT PINK 25CM LRG (MISCELLANEOUS) IMPLANT
STRIP CLOSURE SKIN 1/2X4 (GAUZE/BANDAGES/DRESSINGS) ×2 IMPLANT
SUT CHROMIC 0 CT 1 (SUTURE) ×2 IMPLANT
SUT MNCRL AB 3-0 PS2 27 (SUTURE) ×2 IMPLANT
SUT PLAIN 2 0 (SUTURE) ×2
SUT PLAIN 2 0 XLH (SUTURE) ×2 IMPLANT
SUT PLAIN ABS 2-0 CT1 27XMFL (SUTURE) ×2 IMPLANT
SUT SILK 2 0 SH (SUTURE) IMPLANT
SUT VIC AB 0 CTX 36 (SUTURE) ×4
SUT VIC AB 0 CTX36XBRD ANBCTRL (SUTURE) ×4 IMPLANT
SUT VIC AB 2-0 SH 27 (SUTURE)
SUT VIC AB 2-0 SH 27XBRD (SUTURE) IMPLANT
TOWEL OR 17X24 6PK STRL BLUE (TOWEL DISPOSABLE) ×2 IMPLANT
TRAY FOLEY W/BAG SLVR 14FR LF (SET/KITS/TRAYS/PACK) ×2 IMPLANT
WATER STERILE IRR 1000ML POUR (IV SOLUTION) ×2 IMPLANT

## 2019-02-14 NOTE — Transfer of Care (Signed)
Immediate Anesthesia Transfer of Care Note  Patient: Vanessa Collier  Procedure(s) Performed: CESAREAN SECTION (N/A Abdomen)  Patient Location: PACU  Anesthesia Type:Epidural  Level of Consciousness: awake, alert  and oriented  Airway & Oxygen Therapy: Patient Spontanous Breathing  Post-op Assessment: Report given to RN and Post -op Vital signs reviewed and stable  Post vital signs: Reviewed and stable  Last Vitals:  Vitals Value Taken Time  BP 114/87 02/14/2019 12:21 PM  Temp    Pulse 72 02/14/2019 12:23 PM  Resp 16 02/14/2019 12:23 PM  SpO2 100 % 02/14/2019 12:23 PM  Vitals shown include unvalidated device data.  Last Pain:  Vitals:   02/14/19 1000  TempSrc:   PainSc: 0-No pain         Complications: No apparent anesthesia complications

## 2019-02-14 NOTE — Progress Notes (Signed)
Labor Progress Note  Per HPI: Vanessa Collier is a 28 y.o. female G4 P0030 @ 37 1/7 weeks  for IOL on 02/13/19. Pregnancy has been complicated by Lupus Nephropathy, CHTN-well controlled, Symmetric IUGR, high normal dopplers, Hypothyroidism recently increased form 125 to 150 mcg of Synthroid.  Pt also has a h/o of HSV and has been on Valtrex.  Denies recent outbreaks. Pt has been co-managed with [facility] and Dr. Signe Colt, Nephrology.  Subjective: I was called by RN, RN reported x3 sutttle late, then stopped, pt was up to the bathroom and RN reported frank blood bleeding, running down her leg, pt was laid back in bed, I checked pt and noted quarter size clot, no cervical change, EBL . Pt requesting epidural now, pending new cbc.  Patient Active Problem List   Diagnosis Date Noted  . Hypothyroidism 02/13/2019  . Essential (primary) hypertension 02/13/2019  . Migraine without aura and without status migrainosus, not intractable 02/13/2019  . Anemia 02/13/2019  . High risk pregnancy in young multigravida, third trimester 02/13/2019  . Preeclampsia, severe, third trimester 02/13/2019  . Lupus nephritis (HCC) 09/10/2018  . Supervision of high-risk pregnancy 09/10/2018   Objective: BP 130/77   Pulse 70   Temp 97.8 F (36.6 C) (Other (Comment))   Resp 16   Ht 5\' 1"  (1.549 m)   Wt 89.4 kg   BMI 37.24 kg/m  I/O last 3 completed shifts: In: 3355.8 [P.O.:520; I.V.:2185.8; IV Piggyback:650] Out: 3475 [Urine:3475] Total I/O In: 604.8 [I.V.:604.8] Out: 1975 [Urine:1975] NST: FHR baseline 135 bpm, Variability: minimal , Accelerations:not present, Decelerations:  Absent= Cat 2/NR CTX:  regular, every 4 minutes, lasting 60-80 seoconds Uterus gravid, soft non tender, moderate to palpate with contractions.   SVE:  Dilation: 4 Effacement (%): 50 Station: -2 Exam by:: Dr. Normand Sloop Pitocin at 4 mUn/min  MVU: 150 IUPC, line filled with blood, flushed and re-zeroed , pt tolerated  well. IUPC resting tone: 25-30   Intake/Output Summary (Last 24 hours) at 02/14/2019 0124 Last data filed at 02/14/2019 0100 Gross per 24 hour  Intake 3960.64 ml  Output 5450 ml  Net -1489.36 ml   Assessment:  Vanessa Collier, 28 y.o., G3P0020, with an IUP @ [redacted]w[redacted]d, presented for IOL on 02/13/19. Pregnancy has been complicated by Lupus Nephropathy recent PCR of 4 then elevated to 12, CHTN-well controlled, had x1 SR BP on admission and required x1 dose of IV labetalol, dx with preeclampsia with SF on magnesium, Symmetric IUGR, EFW , high normal dopplers, Hypothyroidism recently increased form 125 to 150 mcg of Synthroid.  Pt also has a h/o of HSV and has been on Valtrex.  Denies recent outbreaks. Pt has been co-managed with [facility] MFM and Dr. Signe Colt, Nephrology. Pt stable, feeling cxt and requesting epidural, pt had bout of frank blood bleeding EBL 100, suspected parital abruption, Dr Normand Sloop updated on bleeding and aware. HA resolved with tylenol.  Patient Active Problem List   Diagnosis Date Noted  . Hypothyroidism 02/13/2019  . Essential (primary) hypertension 02/13/2019  . Migraine without aura and without status migrainosus, not intractable 02/13/2019  . Anemia 02/13/2019  . High risk pregnancy in young multigravida, third trimester 02/13/2019  . Preeclampsia, severe, third trimester 02/13/2019  . Lupus nephritis (HCC) 09/10/2018  . Supervision of high-risk pregnancy 09/10/2018   NICHD: Category 2  Membranes:  AROM @ 1900 on 3/20 x 6hrs, no s/s of infection  Induction:    Cytotec x 3@ 0211, 0617, 1121 on 3/20  Foley Bulb: inserted  I @1330  &O at 1830  Pitocin - 4  Pain management:               IV pain management: x stadol @ 1700 on 3/20  Nitrous: PRN             Epidural placement:  Requesting now, pending CBC updated.   GBS positive  Abx: 6 doses of penicillin  HA: Tylenol  CHTN with superimposed PreE with SF: On labetalol 200mg  TID, BP 132/80, HA, no other s/sx,  stable I&O >37mls  Hypothyroidism: Stable on synthroid  HSV+: No lesion, on valtrex.   Plan: Continue labor plan Continuous monitoring Rest Vaginal bleeding: Monitor, O2 PRN Frequent position changes to facilitate fetal rotation and descent. Will reassess with cervical exam  As indicated with feeling pressure to decrease infections or earlier if necessary Creatinine: WNL continue on pitocin per protocol, will cont to increase until MVU of 200.  HSV: Continue valtrex Hypothyroidism: Continue synthroid daily CHTN with superimposed PreE with SF: Monitor BP, resolution of HA with tylenol, I&O, continue on magnesium until 24 hours PP, continue on labetalol 200mg  TID Anticipate labor progression and vaginal delivery.   Md Dillard aware of strip and bleeding.  Dale Irwindale, NP-C, CNM, MSN 02/14/2019. 1:13 AM

## 2019-02-14 NOTE — Op Note (Signed)
Vanessa Collier  02/14/2019  Indications: Fetal Intolerance To Labor  IUGR Preeclampsia with severe features  Pre-operative Diagnosis: failure to progress, intrauterine growth restriction.   Post-operative Diagnosis: Same   Surgeon: Surgeon(s) and Role:    * Jaymes Graff, MD - Primary   Assistants: Kathalene Frames CNM   Anesthesia: epidural   Procedure Details:  The patient was seen in the Holding Room. The risks, benefits, complications, treatment options, and expected outcomes were discussed with the patient. The patient concurred with the proposed plan, giving informed consent. identified as [name] and the procedure verified as C-Section Delivery. A Time Out was held and the above information confirmed.  After induction of anesthesia, the patient was draped and prepped in the usual sterile manner. A transverse incision was made and carried down through the subcutaneous tissue to the fascia. Fascial incision was made in the midline and extended transversely. The fascia was separated from the underlying rectus muscle superiorly and inferiorly. The peritoneum was identified and entered. Peritoneal incision was extended longitudinally with good visualization of bowel and bladder. The utero-vesical peritoneal reflection was incised transversely and the bladder flap was bluntly freed from the lower uterine segment.  An alexsis retractor was placed in the abdomen.   A low transverse uterine incision was made and extended using bandage scissors. Delivered from cephalic presentation was a  infant, with Apgar scores of 5 at one minute and 9 at five minutes. Cord ph was arterial 7.33 the umbilical cord was clamped and cut cord blood was obtained for evaluation. The placenta was removed Intact and appeared normal. The uterine outline, tubes and ovaries appeared normal}. The uterine incision was closed with running locked sutures of 0Vicryl. A second layer 0 vicrlyl  was used to imbricate the uterine incision    Hemostasis was observed. Lavage was carried out until clear. The alexsis was removed.  The peritoneum was closed with 0 chromic.  The muscles were examined and any bleeders were made hemostatic using bovie cautery device.   The fascia was then reapproximated with running sutures of 0 vicryl.  The subcutaneous tissue was reapproximated  With interrupted stitches using 2-0 plain gut. The subcuticular closure was performed using 3-28monocryl     Instrument, sponge, and needle counts were correct prior the abdominal closure and were correct at the conclusion of the case.    Findings: infant was delivered from vtx presentation. The fluid was clear .  The uterus tubes and ovaries appeared normal.     Estimated Blood Loss: 164   Total IV Fluids:   Urine Output: 425CC OF clear urine  Specimens: placenta to pathology  Complications: no complications  Disposition: PACU - hemodynamically stable.   Maternal Condition: stable   Baby condition / location:  Couplet care / Skin to Skin  Attending Attestation: I performed the procedure.   Signed: Surgeon(s): Jaymes Graff, MD

## 2019-02-14 NOTE — Progress Notes (Signed)
Pt comfortable BP 120/85   Pulse 70   Temp 97.8 F (36.6 C) (Oral)   Resp 16   Ht 5\' 1"  (1.549 m)   Wt 89.4 kg   SpO2 100%   BMI 37.24 kg/m  cx unchanged Pt with some variable and late decels Offered a CS due to Cat 2 with late decels now.   R&B reviewed Pt agrees to proceed

## 2019-02-14 NOTE — Addendum Note (Signed)
Addendum  created 02/14/19 1330 by Jairo Ben, MD   Order list changed, Order sets accessed

## 2019-02-14 NOTE — Progress Notes (Signed)
Labor Progress Note  Per HPI: Vanessa Collier is a 28 y.o. female G4 P0030 @ 37 1/7 weeks  for IOL on 02/13/19. Pregnancy has been complicated by Lupus Nephropathy, CHTN-well controlled, Symmetric IUGR, high normal dopplers, Hypothyroidism recently increased form 125 to 150 mcg of Synthroid.  Pt also has a h/o of HSV and has been on Valtrex.  Denies recent outbreaks. Pt has been co-managed with [facility] MFM and Dr. Signe Colt, Nephrology.  Subjective: Pt resting well in bed with mother at bedside. Pt stable. Strip remains the same, and cervix unchanged.  Patient Active Problem List   Diagnosis Date Noted  . Hypothyroidism 02/13/2019  . Essential (primary) hypertension 02/13/2019  . Migraine without aura and without status migrainosus, not intractable 02/13/2019  . Anemia 02/13/2019  . High risk pregnancy in young multigravida, third trimester 02/13/2019  . Preeclampsia, severe, third trimester 02/13/2019  . Lupus nephritis (HCC) 09/10/2018  . Supervision of high-risk pregnancy 09/10/2018   Objective: BP 112/78   Pulse 80   Temp 97.6 F (36.4 C) (Oral)   Resp 16   Ht 5\' 1"  (1.549 m)   Wt 89.4 kg   SpO2 100%   BMI 37.24 kg/m  I/O last 3 completed shifts: In: 4996.4 [P.O.:520; I.V.:3626.4; IV Piggyback:850] Out: 7250 [Urine:7250] No intake/output data recorded. NST: FHR baseline 125 bpm, Variability: minimal , Accelerations:not present, Decelerations:  Present with variables= Cat 2/NR CTX:  irregular, every 4-5 minutes, lasting 60-80 seoconds Uterus gravid, soft non tender, moderate to palpate with contractions.   SVE:  Dilation: 4 Effacement (%): 70 Station: -1 Exam by:: J. Lacinda Curvin, CNM Pitocin at 11 mUn/min  MVU: 120 IUPC resting tone: 25-30   Intake/Output Summary (Last 24 hours) at 02/14/2019 0750 Last data filed at 02/14/2019 0700 Gross per 24 hour  Intake 3866.01 ml  Output 5725 ml  Net -1858.99 ml   Assessment:  [name], 28 y.o., G3P0020, with an IUP @  [redacted]w[redacted]d, presented for IOL on 02/13/19. Pregnancy has been complicated by Lupus Nephropathy recent PCR of 4 then elevated to 12, CHTN-well controlled, had x1 SR BP on admission and required x1 dose of IV labetalol, dx with preeclampsia with SF on magnesium, Symmetric IUGR, EFW , high normal dopplers, Hypothyroidism recently increased form 125 to 150 mcg of Synthroid.  Pt also has a h/o of HSV and has been on Valtrex.  Denies recent outbreaks. Pt has been co-managed with [facility] MFM and Dr. Signe Colt, Nephrology. Pt stable, no more bleeding, no cervical change.  Patient Active Problem List   Diagnosis Date Noted  . Hypothyroidism 02/13/2019  . Essential (primary) hypertension 02/13/2019  . Migraine without aura and without status migrainosus, not intractable 02/13/2019  . Anemia 02/13/2019  . High risk pregnancy in young multigravida, third trimester 02/13/2019  . Preeclampsia, severe, third trimester 02/13/2019  . Lupus nephritis (HCC) 09/10/2018  . Supervision of high-risk pregnancy 09/10/2018   NICHD: Category 2  Membranes:  AROM @ 1900 on 3/20 x 12hrs, no s/s of infection  Induction:    Cytotec x 3@ 0211, 0617, 1121 on 3/20  Foley Bulb: inserted  I @1330  &O at 1830  Pitocin - 11  Pain management:               IV pain management: x stadol @ 1700 on 3/20  Nitrous: PRN             Epidural placement:  Placed @  GBS positive  Abx: 6 doses of  penicillin  HA: Tylenol resolved HA  CHTN with superimposed PreE with SF: On labetalol 200mg  TID, magnesium, BP 112/78, HA, no other s/sx, stable I&O >45mls  Hypothyroidism: Stable on synthroid  HSV+: No lesion, on valtrex.   Plan: Continue labor plan Continuous monitoring Rest Vaginal bleeding: Monitor, O2 PRN Frequent position changes to facilitate fetal rotation and descent. Will reassess with cervical exam  As indicated with feeling pressure to decrease infections or earlier if necessary Creatinine: WNL continue on pitocin per  protocol, will cont to increase until MVU of 200.  HSV: Continue valtrex Hypothyroidism: Continue synthroid daily CHTN with superimposed PreE with SF: Monitor BP, resolution of HA with tylenol, I&O, continue on magnesium until 24 hours PP, continue on labetalol 200mg  TID, repeat labs now.  Anticipate labor progression and vaginal delivery.   Md Dillard updated at 0700.  Dale Waldo, NP-C, CNM, MSN 02/14/2019. 7:50 AM

## 2019-02-14 NOTE — Anesthesia Preprocedure Evaluation (Signed)
Anesthesia Evaluation  Patient identified by MRN, date of birth, ID band Patient awake    Reviewed: Allergy & Precautions, H&P , NPO status , Patient's Chart, lab work & pertinent test results, reviewed documented beta blocker date and time   Airway Mallampati: II  TM Distance: >3 FB Neck ROM: full    Dental no notable dental hx.    Pulmonary neg pulmonary ROS,    Pulmonary exam normal breath sounds clear to auscultation       Cardiovascular hypertension, Pt. on medications negative cardio ROS Normal cardiovascular exam Rhythm:regular Rate:Normal     Neuro/Psych  Headaches, negative psych ROS   GI/Hepatic negative GI ROS, Neg liver ROS,   Endo/Other  Hypothyroidism   Renal/GU Renal disease  negative genitourinary   Musculoskeletal   Abdominal   Peds  Hematology  (+) Blood dyscrasia, anemia ,   Anesthesia Other Findings Chronic kidney disease  Hypertension  Lupus    Migraines     Reproductive/Obstetrics (+) Pregnancy                             Anesthesia Physical Anesthesia Plan  ASA: III  Anesthesia Plan: Epidural   Post-op Pain Management:    Induction:   PONV Risk Score and Plan:   Airway Management Planned:   Additional Equipment:   Intra-op Plan:   Post-operative Plan:   Informed Consent: I have reviewed the patients History and Physical, chart, labs and discussed the procedure including the risks, benefits and alternatives for the proposed anesthesia with the patient or authorized representative who has indicated his/her understanding and acceptance.     Dental Advisory Given  Plan Discussed with: Anesthesiologist  Anesthesia Plan Comments: (Labs checked- platelets confirmed with RN in room. Fetal heart tracing, per RN, reported to be stable enough for sitting procedure. Discussed epidural, and patient consents to the procedure:  included risk of possible  headache,backache, failed block, allergic reaction, and nerve injury. This patient was asked if she had any questions or concerns before the procedure started.)        Anesthesia Quick Evaluation

## 2019-02-14 NOTE — Anesthesia Procedure Notes (Signed)
Epidural Patient location during procedure: OB Start time: 02/14/2019 2:30 AM End time: 02/14/2019 2:35 AM  Staffing Anesthesiologist: Bethena Midget, MD  Preanesthetic Checklist Completed: patient identified, site marked, surgical consent, pre-op evaluation, timeout performed, IV checked, risks and benefits discussed and monitors and equipment checked  Epidural Patient position: sitting Prep: site prepped and draped and DuraPrep Patient monitoring: continuous pulse ox and blood pressure Approach: midline Location: L3-L4 Injection technique: LOR air  Needle:  Needle type: Tuohy  Needle gauge: 17 G Needle length: 9 cm and 9 Needle insertion depth: 7 cm Catheter type: closed end flexible Catheter size: 19 Gauge Catheter at skin depth: 12 cm Test dose: negative  Assessment Events: blood not aspirated, injection not painful, no injection resistance, negative IV test and no paresthesia

## 2019-02-14 NOTE — Anesthesia Postprocedure Evaluation (Signed)
Anesthesia Post Note  Patient: Vanessa Collier  Procedure(s) Performed: CESAREAN SECTION (N/A Abdomen)     Patient location during evaluation: PACU Anesthesia Type: Epidural Level of consciousness: awake and alert, oriented and patient cooperative Pain management: pain level controlled Vital Signs Assessment: post-procedure vital signs reviewed and stable Respiratory status: spontaneous breathing, nonlabored ventilation and respiratory function stable Cardiovascular status: blood pressure returned to baseline and stable Postop Assessment: epidural receding, no apparent nausea or vomiting and patient able to bend at knees Anesthetic complications: no    Last Vitals:  Vitals:   02/14/19 1230 02/14/19 1245  BP: 124/84 120/89  Pulse: 73 73  Resp: 17 18  Temp: 36.4 C   SpO2: 100% 100%    Last Pain:  Vitals:   02/14/19 1230  TempSrc: Oral  PainSc: 0-No pain   Pain Goal:                Epidural/Spinal Function Cutaneous sensation: Able to Wiggle Toes (02/14/19 1230), Patient able to flex knees: Yes (02/14/19 1230), Patient able to lift hips off bed: Yes (02/14/19 1230), Back pain beyond tenderness at insertion site: No (02/14/19 1230), Progressively worsening motor and/or sensory loss: No (02/14/19 1230), Bowel and/or bladder incontinence post epidural: No (02/14/19 1230)  Lurlean Kernen,E. Leiby Pigeon

## 2019-02-15 ENCOUNTER — Encounter (HOSPITAL_COMMUNITY): Payer: Self-pay | Admitting: Obstetrics and Gynecology

## 2019-02-15 LAB — CBC
HCT: 31 % — ABNORMAL LOW (ref 36.0–46.0)
Hemoglobin: 10.1 g/dL — ABNORMAL LOW (ref 12.0–15.0)
MCH: 29.6 pg (ref 26.0–34.0)
MCHC: 32.6 g/dL (ref 30.0–36.0)
MCV: 90.9 fL (ref 80.0–100.0)
Platelets: 190 10*3/uL (ref 150–400)
RBC: 3.41 MIL/uL — ABNORMAL LOW (ref 3.87–5.11)
RDW: 13.9 % (ref 11.5–15.5)
WBC: 13.7 10*3/uL — ABNORMAL HIGH (ref 4.0–10.5)
nRBC: 0 % (ref 0.0–0.2)

## 2019-02-15 MED ORDER — FE FUMARATE-B12-VIT C-FA-IFC PO CAPS
1.0000 | ORAL_CAPSULE | Freq: Every morning | ORAL | Status: DC
  Administered 2019-02-15 – 2019-02-17 (×3): 1 via ORAL
  Filled 2019-02-15 (×5): qty 1

## 2019-02-15 NOTE — Lactation Note (Signed)
This note was copied from a baby's chart. Lactation Consultation Note  Patient Name: Vanessa Collier [id number] Date: 02/15/2019  Csection delivery baby Vanessa now 52 hours old. SGA, and early term infant in NICU.  Mom reports that she has a Medela DEBP for home use that is hers and that a friend gave her a Spectra breast pump also. Mom reports was not feeling well so has not pumped since this morning.Reviewed and gave NICU booklet.  Urged mom to pump 8-12 times day.  Reviewed flange fit. Reviewed cleaning pump parts and washed her parts for her so they can air dry so she can pump again.  Demo hand expression with mom.  Mom able to easily demo hand expression back. Gave [facility] breastfeeding [facility]ation Services booklet and breastfeeding support group handout.      Maternal Data    Feeding Feeding Type: Formula  LATCH Score                   Interventions    Lactation Tools Discussed/Used     Consult Status      [name] 02/15/2019, 8:53 PM

## 2019-02-15 NOTE — Progress Notes (Signed)
Subjective: Postpartum Day 1: Cesarean Delivery fetal intolerance to labor and preeclampsia with severe features Patient reports incisional pain, tolerating PO and + flatus.    Objective: Vital signs in last 24 hours: Temp:  [97.5 F (36.4 C)-98.4 F (36.9 C)] 98.3 F (36.8 C) (03/22 0410) Pulse Rate:  [59-73] 73 (03/22 0854) Resp:  [12-20] 17 (03/22 0854) BP: (112-144)/(74-93) 125/85 (03/22 0854) SpO2:  [98 %-100 %] 100 % (03/22 0854)  Physical Exam:  General: alert, cooperative and no distress Lochia: appropriate Uterine Fundus: firm Incision: healing well, honey comb in place DVT Evaluation: No evidence of DVT seen on physical exam.  Recent Labs    02/14/19 1334 02/15/19 0730  HGB 10.7* 10.1*  HCT 32.6* 31.0*    Assessment/Plan: Status post Cesarean section. Doing well postoperatively.  Continue current care. Stop Magnesium Sulfate Henderson Newcomer Prothero 02/15/2019, 10:50 AM

## 2019-02-15 NOTE — Discharge Instructions (Addendum)
Cesarean Delivery, Care After °This sheet gives you information about how to care for yourself after your procedure. Your health care provider may also give you more specific instructions. If you have problems or questions, contact your health care provider. °What can I expect after the procedure? °After the procedure, it is common to have: °· A small amount of blood or clear fluid coming from the incision. °· Some redness, swelling, and pain in your incision area. °· Some abdominal pain and soreness. °· Vaginal bleeding (lochia). Even though you did not have a vaginal delivery, you will still have vaginal bleeding and discharge. °· Pelvic cramps. °· Fatigue. °You may have pain, swelling, and discomfort in the tissue between your vagina and your anus (perineum) if: °· Your C-section was unplanned, and you were allowed to labor and push. °· An incision was made in the area (episiotomy) or the tissue tore during attempted vaginal delivery. °Follow these instructions at home: °Incision care ° °· Follow instructions from your health care provider about how to take care of your incision. Make sure you: °? Wash your hands with soap and water before you change your bandage (dressing). If soap and water are not available, use hand sanitizer. °? If you have a dressing, change it or remove it as told by your health care provider. °? Leave stitches (sutures), skin staples, skin glue, or adhesive strips in place. These skin closures may need to stay in place for 2 weeks or longer. If adhesive strip edges start to loosen and curl up, you may trim the loose edges. Do not remove adhesive strips completely unless your health care provider tells you to do that. °· Check your incision area every day for signs of infection. Check for: °? More redness, swelling, or pain. °? More fluid or blood. °? Warmth. °? Pus or a bad smell. °· Do not take baths, swim, or use a hot tub until your health care provider says it's okay. Ask your health  care provider if you can take showers. °· When you cough or sneeze, hug a pillow. This helps with pain and decreases the chance of your incision opening up (dehiscing). Do this until your incision heals. °Medicines °· Take over-the-counter and prescription medicines only as told by your health care provider. °· If you were prescribed an antibiotic medicine, take it as told by your health care provider. Do not stop taking the antibiotic even if you start to feel better. °· Do not drive or use heavy machinery while taking prescription pain medicine. °Lifestyle °· Do not drink alcohol. This is especially important if you are breastfeeding or taking pain medicine. °· Do not use any products that contain nicotine or tobacco, such as cigarettes, e-cigarettes, and chewing tobacco. If you need help quitting, ask your health care provider. °Eating and drinking °· Drink at least 8 eight-ounce glasses of water every day unless told not to by your health care provider. If you breastfeed, you may need to drink even more water. °· Eat high-fiber foods every day. These foods may help prevent or relieve constipation. High-fiber foods include: °? Whole grain cereals and breads. °? Brown rice. °? Beans. °? Fresh fruits and vegetables. °Activity ° °· If possible, have someone help you care for your baby and help with household activities for at least a few days after you leave the hospital. °· Return to your normal activities as told by your health care provider. Ask your health care provider what activities are safe for   you. °· Rest as much as possible. Try to rest or take a nap while your baby is sleeping. °· Do not lift anything that is heavier than 10 lbs (4.5 kg), or the limit that you were told, until your health care provider says that it is safe. °· Talk with your health care provider about when you can engage in sexual activity. This may depend on your: °? Risk of infection. °? How fast you heal. °? Comfort and desire to  engage in sexual activity. °General instructions °· Do not use tampons or douches until your health care provider approves. °· Wear loose, comfortable clothing and a supportive and well-fitting bra. °· Keep your perineum clean and dry. Wipe from front to back when you use the toilet. °· If you pass a blood clot, save it and call your health care provider to discuss. Do not flush blood clots down the toilet before you get instructions from your health care provider. °· Keep all follow-up visits for you and your baby as told by your health care provider. This is important. °Contact a health care provider if: °· You have: °? A fever. °? Bad-smelling vaginal discharge. °? Pus or a bad smell coming from your incision. °? Difficulty or pain when urinating. °? A sudden increase or decrease in the frequency of your bowel movements. °? More redness, swelling, or pain around your incision. °? More fluid or blood coming from your incision. °? A rash. °? Nausea. °? Little or no interest in activities you used to enjoy. °? Questions about caring for yourself or your baby. °· Your incision feels warm to the touch. °· Your breasts turn red or become painful or hard. °· You feel unusually sad or worried. °· You vomit. °· You pass a blood clot from your vagina. °· You urinate more than usual. °· You are dizzy or light-headed. °Get help right away if: °· You have: °? Pain that does not go away or get better with medicine. °? Chest pain. °? Difficulty breathing. °? Blurred vision or spots in your vision. °? Thoughts about hurting yourself or your baby. °? New pain in your abdomen or in one of your legs. °? A severe headache. °· You faint. °· You bleed from your vagina so much that you fill more than one sanitary pad in one hour. Bleeding should not be heavier than your heaviest period. °Summary °· After the procedure, it is common to have pain at your incision site, abdominal cramping, and slight bleeding from your vagina. °· Check  your incision area every day for signs of infection. °· Tell your health care provider about any unusual symptoms. °· Keep all follow-up visits for you and your baby as told by your health care provider. °This information is not intended to replace advice given to you by your health care provider. Make sure you discuss any questions you have with your health care provider. °Document Released: 08/04/2002 Document Revised: 05/21/2018 Document Reviewed: 05/21/2018 °Elsevier Interactive Patient Education © 2019 [employer] ° °Preeclampsia and Eclampsia ° °Preeclampsia is a serious condition that may develop during pregnancy. It is also called toxemia of pregnancy. This condition causes high blood pressure along with other symptoms, such as swelling and headaches. These symptoms may develop as the condition gets worse. Preeclampsia may occur at 20 weeks of pregnancy or later. °Diagnosing and treating preeclampsia early is very important. If not treated early, it can cause serious problems for you and your baby. One problem it   can lead to is eclampsia. Eclampsia is a condition that causes muscle jerking or shaking (convulsions or seizures) and other serious problems for the mother. During pregnancy, delivering your baby may be the best treatment for preeclampsia or eclampsia. For most women, preeclampsia and eclampsia symptoms go away after giving birth. °In rare cases, a woman may develop preeclampsia after giving birth (postpartum preeclampsia). This usually occurs within 48 hours after childbirth but may occur up to 6 weeks after giving birth. °What are the causes? °The cause of preeclampsia is not known. °What increases the risk? °The following risk factors make you more likely to develop preeclampsia: °· Being pregnant for the first time. °· Having had preeclampsia during a past pregnancy. °· Having a family history of preeclampsia. °· Having high blood pressure. °· Being pregnant with more than one baby. °· Being  35 or older. °· Being African-American. °· Having kidney disease or diabetes. °· Having medical conditions such as lupus or blood diseases. °· Being very overweight (obese). °What are the signs or symptoms? °The earliest signs of preeclampsia are: °· High blood pressure. °· Increased protein in your urine. Your health care provider will check for this at every visit before you give birth (prenatal visit). °Other symptoms that may develop as the condition gets worse include: °· Severe headaches. °· Sudden weight gain. °· Swelling of the hands, face, legs, and feet. °· Nausea and vomiting. °· Vision problems, such as blurred or double vision. °· Numbness in the face, arms, legs, and feet. °· Urinating less than usual. °· Dizziness. °· Slurred speech. °· Abdominal pain, especially upper abdominal pain. °· Convulsions or seizures. °How is this diagnosed? °There are no screening tests for preeclampsia. Your health care provider will ask you about symptoms and check for signs of preeclampsia during your prenatal visits. You may also have tests that include: °· Urine tests. °· Blood tests. °· Checking your blood pressure. °· Monitoring your baby’s heart rate. °· Ultrasound. °How is this treated? °You and your health care provider will determine the treatment approach that is best for you. Treatment may include: °· Having more frequent prenatal exams to check for signs of preeclampsia, if you have an increased risk for preeclampsia. °· Medicine to lower your blood pressure. °· Staying in the hospital, if your condition is severe. There, treatment will focus on controlling your blood pressure and the amount of fluids in your body (fluid retention). °· Taking medicine (magnesium sulfate) to prevent seizures. This may be given as an injection or through an IV. °· Taking a low-dose aspirin during your pregnancy. °· Delivering your baby early, if your condition gets worse. You may have your labor started with medicine (induced),  or you may have a cesarean delivery. °Follow these instructions at home: °Eating and drinking ° °· Drink enough fluid to keep your urine pale yellow. °· Avoid caffeine. °Lifestyle °· Do not use any products that contain nicotine or tobacco, such as cigarettes and e-cigarettes. If you need help quitting, ask your health care provider. °· Do not use alcohol or drugs. °· Avoid stress as much as possible. Rest and get plenty of sleep. °General instructions °· Take over-the-counter and prescription medicines only as told by your health care provider. °· When lying down, lie on your left side. This keeps pressure off your major blood vessels. °· When sitting or lying down, raise (elevate) your feet. Try putting some pillows underneath your lower legs. °· Exercise regularly. Ask your health care provider what   kinds of exercise are best for you. °· Keep all follow-up and prenatal visits as told by your health care provider. This is important. °How is this prevented? °There is no known way of preventing preeclampsia or eclampsia from developing. However, to lower your risk of complications and detect problems early: °· Get regular prenatal care. Your health care provider may be able to diagnose and treat the condition early. °· Maintain a healthy weight. Ask your health care provider for help managing weight gain during pregnancy. °· Work with your health care provider to manage any long-term (chronic) health conditions you have, such as diabetes or kidney problems. °· You may have tests of your blood pressure and kidney function after giving birth. °· Your health care provider may have you take low-dose aspirin during your next pregnancy. °Contact a health care provider if: °· You have symptoms that your health care provider told you may require more treatment or monitoring, such as: °? Headaches. °? Nausea or vomiting. °? Abdominal pain. °? Dizziness. °? Light-headedness. °Get help right away if: °· You have  severe: °? Abdominal pain. °? Headaches that do not get better. °? Dizziness. °? Vision problems. °? Confusion. °? Nausea or vomiting. °· You have any of the following: °? A seizure. °? Sudden, rapid weight gain. °? Sudden swelling in your hands, ankles, or face. °? Trouble moving any part of your body. °? Numbness in any part of your body. °? Trouble speaking. °? Abnormal bleeding. °· You faint. °Summary °· Preeclampsia is a serious condition that may develop during pregnancy. It is also called toxemia of pregnancy. °· This condition causes high blood pressure along with other symptoms, such as swelling and headaches. °· Diagnosing and treating preeclampsia early is very important. If not treated early, it can cause serious problems for you and your baby. °· Get help right away if you have symptoms that your health care provider told you to watch for. °This information is not intended to replace advice given to you by your health care provider. Make sure you discuss any questions you have with your health care provider. °Document Released: 11/09/2000 Document Revised: 10/29/2017 Document Reviewed: 06/18/2016 °Elsevier Interactive Patient Education © 2019 Elsevier Inc. ° °

## 2019-02-16 ENCOUNTER — Encounter (HOSPITAL_COMMUNITY): Payer: Self-pay | Admitting: Obstetrics and Gynecology

## 2019-02-16 LAB — CBC
HCT: 27.4 % — ABNORMAL LOW (ref 36.0–46.0)
Hemoglobin: 8.9 g/dL — ABNORMAL LOW (ref 12.0–15.0)
MCH: 29.6 pg (ref 26.0–34.0)
MCHC: 32.5 g/dL (ref 30.0–36.0)
MCV: 91 fL (ref 80.0–100.0)
Platelets: 175 10*3/uL (ref 150–400)
RBC: 3.01 MIL/uL — ABNORMAL LOW (ref 3.87–5.11)
RDW: 14.1 % (ref 11.5–15.5)
WBC: 14.7 10*3/uL — ABNORMAL HIGH (ref 4.0–10.5)
nRBC: 0 % (ref 0.0–0.2)

## 2019-02-16 LAB — COMPREHENSIVE METABOLIC PANEL
ALT: 22 U/L (ref 0–44)
AST: 22 U/L (ref 15–41)
Albumin: 1.7 g/dL — ABNORMAL LOW (ref 3.5–5.0)
Alkaline Phosphatase: 103 U/L (ref 38–126)
Anion gap: 7 (ref 5–15)
BUN: 6 mg/dL (ref 6–20)
CO2: 22 mmol/L (ref 22–32)
Calcium: 7.6 mg/dL — ABNORMAL LOW (ref 8.9–10.3)
Chloride: 103 mmol/L (ref 98–111)
Creatinine, Ser: 0.74 mg/dL (ref 0.44–1.00)
GFR calc Af Amer: >60 mL/min (ref >60)
GFR calc non Af Amer: >60 mL/min (ref >60)
Glucose, Bld: 99 mg/dL (ref 70–99)
Potassium: 4.6 mmol/L (ref 3.5–5.1)
Sodium: 132 mmol/L — ABNORMAL LOW (ref 135–145)
Total Bilirubin: 0.4 mg/dL (ref 0.3–1.2)
Total Protein: 4.4 g/dL — ABNORMAL LOW (ref 6.5–8.1)

## 2019-02-16 MED ORDER — ACETAMINOPHEN 500 MG PO TABS
1000.0000 mg | ORAL_TABLET | Freq: Four times a day (QID) | ORAL | Status: DC | PRN
  Administered 2019-02-16: 1000 mg via ORAL
  Filled 2019-02-16: qty 2

## 2019-02-16 MED ORDER — OXYCODONE HCL 5 MG PO TABS
5.0000 mg | ORAL_TABLET | ORAL | Status: DC | PRN
  Administered 2019-02-16 – 2019-02-17 (×4): 5 mg via ORAL
  Filled 2019-02-16 (×4): qty 1

## 2019-02-16 NOTE — Lactation Note (Signed)
This note was copied from a baby's chart. Lactation Consultation Note  Patient Name: Vanessa Collier Date: 02/16/2019 Reason for consult: Follow-up assessment;NICU baby;Early term 37-38.6wks  As LC entered mom mentioned she was having belly gas pain.  Plans to visit baby in NICU and take her pump pieces with  Her to pump in front of baby.  Per mom seems to be able to obtain more milk when hand expressing,.  LC recommended prior to pumping hand express / pump and hand express to enhance let down.  Per mom has DEBP Medela at home. WIC referral to [place] won't be needed.  LC reviewed supply and demand and the importance of consistent pumping 8-10 times in 24 hours  For 15 -20 mins / save milk to take to NICU.     Maternal Data Has patient been taught Hand Expression?: Yes(per mom  obtains milk easier with hand expressing compared to pumping / has pumped several times in the last 24 hours )  Feeding Feeding Type: Formula  LATCH Score                   Interventions Interventions: Breast feeding basics reviewed;DEBP  Lactation Tools Discussed/Used Tools: Pump;Flanges Flange Size: 24;Other (comment)(per mom the #24 F is comfortable ) Breast pump type: Double-Electric Breast Pump WIC Program: Yes(per mom [facility] )   Consult Status Consult Status: Follow-up Date: 02/17/19 Follow-up type: In-patient    Vanessa Collier 02/16/2019, 12:17 PM

## 2019-02-16 NOTE — Addendum Note (Signed)
Addendum  created 02/16/19 2146 by Jairo Ben, MD   Intraprocedure Event edited

## 2019-02-16 NOTE — Progress Notes (Signed)
Subjective: Postop Day 2: Cesarean Delivery No complaints.  Pain controlled.  Lochia normal.  Breast feeding yes.  Objective: Temp:  [98.2 F (36.8 C)-99 F (37.2 C)] 98.3 F (36.8 C) (03/23 1210) Pulse Rate:  [79-106] 90 (03/23 1210) Resp:  [17-18] 18 (03/23 1210) BP: (115-128)/(68-82) 121/79 (03/23 1210) SpO2:  [99 %-100 %] 99 % (03/23 1210) Weight:  [87.6 kg] 87.6 kg (03/23 0458)  Physical Exam: Gen: NAD Lochia: Not visualized Abdomen:  distended Uterine Fundus: firm, appropriately tender Incision: clean, dry and intact, healing well DVT Evaluation: 3+ pitting Edema present, no calf tenderness bilaterally   Recent Labs    02/15/19 0730 02/16/19 0538  HGB 10.1* 8.9*  HCT 31.0* 27.4*    Assessment/Plan: Status post C-section-doing well postoperatively. CHTN with Superimposed Preeclampsia (severe, by BP)  S/p Magnesium  BP well controlled on Labetalol 200 mg BID Lupus Nephropathy  On Plaquaneil.  Recommend close f/u with Nephrologist. Hypothyroidism on Synthroid Continue routine post op care. Anticipate discharge tomorrow. Baby girl in NICU - stable.    Geryl Rankins 02/16/2019, 1:12 PM

## 2019-02-17 MED ORDER — OXYCODONE HCL 5 MG PO TABS: 5.0000 mg | ORAL_TABLET | ORAL | 0 refills | Status: DC | PRN

## 2019-02-17 MED ORDER — ACETAMINOPHEN 500 MG PO TABS: 1000.0000 mg | ORAL_TABLET | Freq: Three times a day (TID) | ORAL | 1 refills | Status: DC | PRN

## 2019-02-17 NOTE — Lactation Note (Addendum)
This note was copied from a baby's chart. Lactation Consultation Note  Patient Name: Girl Sahib Shankles JZPHX'T Date: 02/17/2019 Reason for consult: Follow-up assessment;NICU baby;Early term 39-38.6wks  Baby is 24 hours old / NICU  LC visited mom in her room.  Mom mentioned she had pumped x 3 in the last 24 hours / and notice she got more  'when she pumped in NICU. LC reassured mom if she continued to pump after STS with baby and  In front of the baby the volume would gradually increase.  Per mom using the #24 F and its comfortable. LC discussed sore nipple and engorgement prevention  And tx and recommended when her milk came in if the #24 F was uncomfortable to increase to the #27 F And when not as full decrease to the #24 F .  Per mom has DEBP Medela at home.  Mother informed of post-discharge support and given phone number to the lactation department, including services for phone call assistance; out-patient appointments; and breastfeeding support group. List of other breastfeeding resources in the community given in the handout. Encouraged mother to call for problems or concerns related to breastfeeding.   Maternal Data Has patient been taught Hand Expression?: Yes  Feeding Feeding Type: Formula  LATCH Score                   Interventions Interventions: Breast feeding basics reviewed;DEBP  Lactation Tools Discussed/Used Tools: Pump Flange Size: 24 Breast pump type: Double-Electric Breast Pump Pump Review: Milk Storage;Setup, frequency, and cleaning Initiated by:: MAI reviewed    Consult Status Consult Status: PRN Date: (baby in NICU ) Follow-up type: Other (comment)(baby in NICU )    Matilde Sprang Zury Fazzino 02/17/2019, 10:06 AM

## 2019-02-17 NOTE — Progress Notes (Signed)
Pt discharged with printed instructions. Patient verbalized an understanding. No concerns noted. Doranne Schmutz L Alpha Mysliwiec, RN 

## 2019-02-17 NOTE — Clinical Social Work Maternal (Signed)
CLINICAL SOCIAL WORK MATERNAL/CHILD NOTE  Patient Details  Name: Vanessa Collier MRN: [id number] Date of Birth: May 21, 1991  Date:  02/17/2019  Clinical Social Worker Initiating Note:  Laurey Arrow Date/Time: Initiated:  02/16/19/1141     Child's Name:  Vanessa Collier   Biological Parents:  Mother, Father(FOB is Vanessa Collier DOB is 11/17/1988.  Per MOB, FOB has not been as supportive as MOB had expected .)   Need for Interpreter:  None   Reason for Referral:  Parental Support of Premature Babies < 32 weeks/or Critically Ill babies   Address:  [address] [place] [address]    Phone number:  [phone] (home)     Additional phone number:  Household Members/Support Persons (HM/SP):       HM/SP Name Relationship DOB or Age  HM/SP -1        HM/SP -2        HM/SP -3        HM/SP -4        HM/SP -5        HM/SP -6        HM/SP -7        HM/SP -8          Natural Supports (not living in the home):  Extended Family, Immediate Family(MOB's family lives in [place], [place] and will provide support from a distance. )   Professional Supports: None   Employment: Full-time   Type of Work: MOB works as a Animal nutritionist.    Education:  Nurse, adult   Homebound arranged:    Museum/gallery curator Resources:  Medicaid   Other Resources:  [employer], Physicist, medical    Cultural/Religious Considerations Which May Impact Care:  None Reported  Strengths:  Ability to meet basic needs , Home prepared for child (CSW provided MOB with a list of Peds. )   Psychotropic Medications:         Pediatrician:       Pediatrician List:   [place]      Pediatrician Fax Number:    Risk Factors/Current Problems:  Family/Relationship Issues (Relationship problems with FOB.)   Cognitive State:  Alert , Linear Thinking , Insightful    Mood/Affect:  Interested  , Happy , Relaxed , Comfortable    CSW Assessment: CSW met with MOB at infant's bedside.  When CSW arrived, MOB was bonding with infant as evidence by engaging in skin to skin; MOB and infant both appeared comfortable. CSW explained CSW's role and MOB was receptive to meeting with CSW.  MOB was polite, forthcoming, and easy to engage.   CSW inquired about MOB's thoughts and feeling regarding infant's NICU admission.  MOB reported, "At first I as really nervous and scared, but know if feel better because she is fine." CSW validated and normalized MOB's thoughts and feeling and reviewed other feelings that MOB may experience during the postpartum period.   CSW provided education regarding the baby blues period vs. perinatal mood disorders, discussed treatment and gave resources for mental health follow up if concerns arise.  CSW recommends self-evaluation during the postpartum time period using the New Mom Checklist from Postpartum Progress and encouraged MOB to contact a medical professional if symptoms are noted at any time. CSW assessed for safety and MOB denied SI, HI, and DV.    MOB acknowledged that  MOB and FOB "are not in a good place right now."  CSW processed what that meant for MOB and MOB shared that FOB is very upset that MOB did not make infant's last name the same as FOB.  Per MOB, FOB voiced, "He will not take care of a child that does not have his last name."  CSW assisted MOB with processing her feeling regarding FOB's statement.  MOB acknowledged feeling mad with FOB and communicated that she does not think FOB is going to be supportive. MOB asked questions regarding having FOB removed as the support person and having MGM added.  CSW reviewed the policy regarding NICU restricted visitation and encouraged MOB to let bedside nurse or CSW know what  MOB decided.    CSW assessed for other psychosocial stressors and MOB denied all other stressors. MOB reported having all essential items for  infant and feeling prepared to parent.   CSW will continue to provide resources and supports to family while infant remains in the NICU.   CSW Plan/Description:  Psychosocial Support and Ongoing Assessment of Needs, Sudden Infant Death Syndrome (SIDS) Education, Perinatal Mood and Anxiety Disorder (PMADs) Education, Other Patient/Family Education   Laurey Arrow, MSW, LCSW Clinical Social Work [phone]   Dimple Nanas, LCSW 02/17/2019, 11:47 AM

## 2019-02-17 NOTE — Discharge Summary (Signed)
OB Discharge Summary     Patient Name: Vanessa Collier DOB: 1991/08/15 MRN: [id number]  Date of admission: 02/13/2019 Delivering MD: Jaymes Graff   Date of discharge: 02/17/2019  Admitting diagnosis: pregnancy Intrauterine pregnancy: [redacted]w[redacted]d     Secondary diagnosis:  Principal Problem:   Preeclampsia, severe, third trimester Active Problems:   High risk pregnancy in young multigravida, third trimester   Pre-eclampsia  Additional problems: Lupus Nephropathy, CHTN, Hypothyroidism     Discharge diagnosis: CHTN with superimposed preeclampsia and Early Term pregnancy delivered                                                                                                Post partum procedures:Magnesium sulfate  Augmentation: AROM, Pitocin, Cytotec and Foley Balloon  Complications: None  Hospital course:  Induction of Labor With Cesarean Section  28 y.o. yo G3P0020 at [redacted]w[redacted]d was admitted to the hospital 02/13/2019 for induction of labor. Patient had a labor course significant for Late declerations. The patient went for cesarean section due to Fetal intolerence to labor, and delivered a Viable infant,02/14/2019  Membrane Rupture Time/Date: 6:49 PM ,02/13/2019   Details of operation can be found in separate operative Note.  Patient had an uncomplicated postpartum course. She is ambulating, tolerating a regular diet, passing flatus, and urinating well.  Patient is discharged home in stable condition on 02/17/19.                                    Physical exam  Vitals:   02/17/19 0110 02/17/19 0111 02/17/19 0545 02/17/19 0549  BP:  131/80 129/78   Pulse:  76 83   Resp:  18 18   Temp:  98.6 F (37 C) 98.6 F (37 C)   TempSrc:  Oral Oral   SpO2: 100% 100% 100%   Weight:    86.2 kg  Height:       General: alert, cooperative and no distress Lochia: appropriate Uterine Fundus: Firm, appropriately tender Incision: Healing well with no significant drainage, Dressing is clean, dry, and  intact DVT Evaluation: No evidence of DVT seen on physical exam. Calf/Ankle edema is present Labs: Lab Results  Component Value Date   WBC 14.7 (H) 02/16/2019   HGB 8.9 (L) 02/16/2019   HCT 27.4 (L) 02/16/2019   MCV 91.0 02/16/2019   PLT 175 02/16/2019   CMP Latest Ref Rng & Units 02/16/2019  Glucose 70 - 99 mg/dL 99  BUN 6 - 20 mg/dL 6  Creatinine 2.03 - 5.59 mg/dL 7.41  Sodium 638 - 453 mmol/L 132(L)  Potassium 3.5 - 5.1 mmol/L 4.6  Chloride 98 - 111 mmol/L 103  CO2 22 - 32 mmol/L 22  Calcium 8.9 - 10.3 mg/dL 7.6(L)  Total Protein 6.5 - 8.1 g/dL 6.4(W)  Total Bilirubin 0.3 - 1.2 mg/dL 0.4  Alkaline Phos 38 - 126 U/L 103  AST 15 - 41 U/L 22  ALT 0 - 44 U/L 22    Discharge instruction: per After Visit Summary and "Baby and  Me Booklet".  After visit meds:  Allergies as of 02/17/2019   No Known Allergies     Medication List    STOP taking these medications   aspirin 81 MG chewable tablet   valACYclovir 500 MG tablet Commonly known as:  VALTREX     TAKE these medications   acetaminophen 500 MG tablet Commonly known as:  TYLENOL Take 2 tablets (1,000 mg total) by mouth every 8 (eight) hours as needed for moderate pain.   hydroxychloroquine 200 MG tablet Commonly known as:  PLAQUENIL Take 200 mg by mouth daily.   Integra 62.5-62.5-40-3 MG Caps TAKE 1 CAPSULE BETWEEN MEALS ONCE A DAY ORALLY 30   labetalol 200 MG tablet Commonly known as:  NORMODYNE Take 200 mg by mouth 2 (two) times daily.   levothyroxine 150 MCG tablet Commonly known as:  SYNTHROID, LEVOTHROID Take 150 mcg by mouth daily before breakfast.   oxyCODONE 5 MG immediate release tablet Commonly known as:  Oxy IR/ROXICODONE Take 1 tablet (5 mg total) by mouth every 4 (four) hours as needed for moderate pain or severe pain.   prenatal multivitamin Tabs tablet Take 1 tablet by mouth daily at 12 noon.            Discharge Care Instructions  (From admission, onward)         Start      Ordered   02/17/19 0000  Discharge wound care:    Comments:  Do not get get steristrips wet.   02/17/19 0615          Diet: low salt diet  Activity: Advance as tolerated. Pelvic rest for 6 weeks.   Outpatient follow up:BP check in 1 week Follow up Appt:No future appointments. Follow up Visit:No follow-ups on file.  Postpartum contraception: Nexplanon  Newborn Data: Live born female  Birth Weight: 3 lb 9.5 oz (1630 g) APGAR: 5, 9  Newborn Delivery   Birth date/time:  02/14/2019 11:29:00 Delivery type:  C-Section, Low Vertical Trial of labor:  Yes C-section categorization:  Primary     Baby Feeding: Breast Disposition:NICU   02/17/2019 Geryl Rankins, MD

## 2019-05-28 ENCOUNTER — Encounter (HOSPITAL_COMMUNITY): Payer: Self-pay

## 2019-07-23 NOTE — Progress Notes (Deleted)
Subjective:   Vanessa Collier was seen in consultation in the movement disorder clinic at the request of Wenda Low, MD.  Patient is a 28 year old female with a history of lupus and lupus nephropathy, hypertension, hypothyroidism who presents for evaluation of tremor.  Medical records made available to me are reviewed.  Tremor started a few weeks ago.  Tremor started approximately *** ago and involves the ***.  Tremor is most noticeable when ***.   There is *** family hx of tremor.    Affected by caffeine:  {yes no:314532} Affected by alcohol:  {yes no:314532} Affected by stress:  {yes no:314532} Affected by fatigue:  {yes no:314532} Spills soup if on spoon:  {yes no:314532} Spills glass of liquid if full:  {yes no:314532} Affects ADL's (tying shoes, brushing teeth, etc):  {yes no:314532}  Current/Previously tried tremor medications: ***  Current medications that may exacerbate tremor:  ***  Outside reports reviewed: {Outside review:15817}.  No Known Allergies  Current Outpatient Medications  Medication Instructions  . acetaminophen (TYLENOL) 1,000 mg, Oral, Every 8 hours PRN  . Fe Fum-FePoly-Vit C-Vit B3 (INTEGRA) 62.5-62.5-40-3 MG CAPS TAKE 1 CAPSULE BETWEEN MEALS ONCE A DAY ORALLY 30  . hydroxychloroquine (PLAQUENIL) 200 mg, Oral, Daily  . labetalol (NORMODYNE) 200 mg, Oral, 2 times daily  . levothyroxine (SYNTHROID) 150 mcg, Oral, Daily before breakfast  . oxyCODONE (OXY IR/ROXICODONE) 5 mg, Oral, Every 4 hours PRN  . Prenatal Vit-Fe Fumarate-FA (PRENATAL MULTIVITAMIN) TABS tablet 1 tablet, Oral, Daily    Past Medical History:  Diagnosis Date  . Chronic kidney disease   . Hypertension   . Lupus ([place])   . Lupus ([place])   . Migraines     Past Surgical History:  Procedure Laterality Date  . CESAREAN SECTION N/A 02/14/2019   Procedure: CESAREAN SECTION;  Surgeon: Crawford Givens, MD;  Location: [facility];  Service: Obstetrics;  Laterality: N/A;  . TONSILLECTOMY       Social History   Socioeconomic History  . Marital status: Single    Spouse name: Not on file  . Number of children: Not on file  . Years of education: Not on file  . Highest education level: Not on file  Occupational History  . Not on file  Social Needs  . Financial resource strain: Not on file  . Food insecurity    Worry: Not on file    Inability: Not on file  . Transportation needs    Medical: Not on file    Non-medical: Not on file  Tobacco Use  . Smoking status: Never Smoker  . Smokeless tobacco: Never Used  Substance and Sexual Activity  . Alcohol use: No  . Drug use: No  . Sexual activity: Not Currently    Birth control/protection: None  Lifestyle  . Physical activity    Days per week: 5 days    Minutes per session: 30 min  . Stress: Not at all  Relationships  . Social Herbalist on phone: Patient refused    Gets together: Patient refused    Attends religious service: Patient refused    Active member of club or organization: Patient refused    Attends meetings of clubs or organizations: Patient refused    Relationship status: Patient refused  . Intimate partner violence    Fear of current or ex partner: Patient refused    Emotionally abused: Patient refused    Physically abused: Patient refused    Forced sexual activity: Patient refused  Other Topics Concern  . Not on file  Social History Narrative  . Not on file    Family Status  Relation Name Status  . Mother  Alive    Review of Systems ROS   Objective:   VITALS:  There were no vitals filed for this visit. Gen:  Appears stated age and in NAD. HEENT:  Normocephalic, atraumatic. The mucous membranes are moist. The superficial temporal arteries are without ropiness or tenderness. Cardiovascular: Regular rate and rhythm. Lungs: Clear to auscultation bilaterally. Neck: There are no carotid bruits noted bilaterally.  NEUROLOGICAL:  Orientation:  The patient is alert and oriented x 3.   Recent and remote memory are intact.  Attention span and concentration are normal.  Able to name objects and repeat without trouble.  Fund of knowledge is appropriate Cranial nerves: There is good facial symmetry. The pupils are equal round and reactive to light bilaterally. Fundoscopic exam reveals clear disc margins bilaterally. Extraocular muscles are intact and visual fields are full to confrontational testing. Speech is fluent and clear. Soft palate rises symmetrically and there is no tongue deviation. Hearing is intact to conversational tone. Tone: Tone is good throughout. Sensation: Sensation is intact to light touch and pinprick throughout (facial, trunk, extremities). Vibration is intact at the bilateral big toe. There is no extinction with double simultaneous stimulation. There is no sensory dermatomal level identified. Coordination:  The patient has no dysdiadichokinesia or dysmetria. Motor: Strength is 5/5 in the bilateral upper and lower extremities.  Shoulder shrug is equal bilaterally.  There is no pronator drift.  There are no fasciculations noted. DTR's: Deep tendon reflexes are 2/4 at the bilateral biceps, triceps, brachioradialis, patella and achilles.  Plantar responses are downgoing bilaterally. Gait and Station: The patient is able to ambulate without difficulty. The patient is able to heel toe walk without any difficulty. The patient is able to ambulate in a tandem fashion. The patient is able to stand in the Romberg position.   MOVEMENT EXAM: Tremor:  There is *** tremor in the UE, noted most significantly with action.  The patient is *** able to draw Archimedes spirals without significant difficulty.  There is *** tremor at rest.  The patient is *** able to pour water from one glass to another without spilling it.    Chemistry      Component Value Date/Time   NA 132 (L) 02/16/2019 0538   K 4.6 02/16/2019 0538   CL 103 02/16/2019 0538   CO2 22 02/16/2019 0538   BUN 6  02/16/2019 0538   CREATININE 0.74 02/16/2019 0538      Component Value Date/Time   CALCIUM 7.6 (L) 02/16/2019 0538   ALKPHOS 103 02/16/2019 0538   AST 22 02/16/2019 0538   ALT 22 02/16/2019 0538   BILITOT 0.4 02/16/2019 0538     No results found for: TSH      Assessment/Plan:   1.  Essential Tremor.  -This is evidenced by the symmetrical nature and longstanding hx of gradually getting worse.  We discussed nature and pathophysiology.  We discussed that this can continue to gradually get worse with time.  We discussed that some medications can worsen this, as can caffeine use.  We discussed medication therapy as well as surgical therapy.  Ultimately, the patient decided to ***.    CC:  Georgann HousekeeperHusain, Karrar, MD

## 2019-07-27 ENCOUNTER — Ambulatory Visit: Payer: Medicaid Other | Admitting: Neurology

## 2019-10-14 NOTE — Progress Notes (Signed)
Subjective:   Vanessa Collier was seen in consultation in the movement disorder clinic at the request of Georgann Housekeeper, MD.  The evaluation is for tremor.  Medical records made available to me are reviewed.  Patient is a 28 year old female with a history of lupus nephropathy, hypothyroidism and is status post C-section in March, 2020 who is referred for L hand tremor.  Pt states that she no longer has tremor but she came because of hand pain in the wrist and hand on the R.  States that it was always on the R.  It is worse at night.  It does not awaken her from sleep.  It is not numb.  She has loss of grasp on the hand.  She has trouble lifting up the baby without pain.  The pain is in the R wrist and R palm.  There is no discomfort in the fingers.    Outside reports reviewed: historical medical records, office notes and referral letter/letters.  No hx of neuroimaging  No Known Allergies  Current Outpatient Medications  Medication Instructions  . acetaminophen (TYLENOL) 1,000 mg, Oral, Every 8 hours PRN  . Fe Fum-FePoly-Vit C-Vit B3 (INTEGRA) 62.5-62.5-40-3 MG CAPS TAKE 1 CAPSULE BETWEEN MEALS ONCE A DAY ORALLY 30  . hydroxychloroquine (PLAQUENIL) 200 mg, Oral, Daily  . labetalol (NORMODYNE) 200 mg, Oral, 2 times daily  . levothyroxine (SYNTHROID) 150 mcg, Oral, Daily before breakfast  . oxyCODONE (OXY IR/ROXICODONE) 5 mg, Oral, Every 4 hours PRN  . Prenatal Vit-Fe Fumarate-FA (PRENATAL MULTIVITAMIN) TABS tablet 1 tablet, Oral, Daily    Past Medical History:  Diagnosis Date  . Chronic kidney disease   . Hypertension   . Lupus ([facility])   . Lupus ([facility])   . Migraines     Past Surgical History:  Procedure Laterality Date  . CESAREAN SECTION N/A 02/14/2019   Procedure: CESAREAN SECTION;  Surgeon: Jaymes Graff, MD;  Location: [facility];  Service: Obstetrics;  Laterality: N/A;  . TONSILLECTOMY      Social History   Socioeconomic History  . Marital status: Single    Spouse name: Not  on file  . Number of children: Not on file  . Years of education: Not on file  . Highest education level: Not on file  Occupational History  . Not on file  Social Needs  . Financial resource strain: Not on file  . Food insecurity    Worry: Not on file    Inability: Not on file  . Transportation needs    Medical: Not on file    Non-medical: Not on file  Tobacco Use  . Smoking status: Never Smoker  . Smokeless tobacco: Never Used  Substance and Sexual Activity  . Alcohol use: No  . Drug use: No  . Sexual activity: Not Currently    Birth control/protection: None  Lifestyle  . Physical activity    Days per week: 5 days    Minutes per session: 30 min  . Stress: Not at all  Relationships  . Social Musician on phone: Patient refused    Gets together: Patient refused    Attends religious service: Patient refused    Active member of club or organization: Patient refused    Attends meetings of clubs or organizations: Patient refused    Relationship status: Patient refused  . Intimate partner violence    Fear of current or ex partner: Patient refused    Emotionally abused: Patient refused  Physically abused: Patient refused    Forced sexual activity: Patient refused  Other Topics Concern  . Not on file  Social History Narrative  . Not on file    Family Status  Relation Name Status  . Mother  Alive    Review of Systems ROS   Objective:   VITALS:   Vitals:   10/16/19 1002  BP: 132/86  Pulse: 80  Resp: 16  SpO2: 100%  Weight: 167 lb 12.8 oz (76.1 kg)  Height: 5\' 1"  (1.549 m)   Gen:  Appears stated age and in NAD. HEENT:  Normocephalic, atraumatic. The mucous membranes are moist. The superficial temporal arteries are without ropiness or tenderness. Cardiovascular: Regular rate and rhythm. Lungs: Clear to auscultation bilaterally. Neck: There are no carotid bruits noted bilaterally.  NEUROLOGICAL:  Orientation:  The patient is alert and oriented  x 3.  Recent and remote memory are intact.  Attention span and concentration are normal.  Able to name objects and repeat without trouble.  Fund of knowledge is appropriate Cranial nerves: There is good facial symmetry.  Extraocular muscles are intact and visual fields are full to confrontational testing. Speech is fluent and clear. Soft palate rises symmetrically and there is no tongue deviation. Hearing is intact to conversational tone. Tone: Tone is good throughout. Sensation: Sensation is intact to light touch and pinprick throughout (facial, trunk, extremities).  Pinprick is decreased over the right forearm and right APB, right index finger compared to the right pinky and right ring finger.  Vibration is intact at the bilateral big toe. There is no extinction with double simultaneous stimulation. There is no sensory dermatomal level identified. Coordination:  The patient has no dysdiadichokinesia or dysmetria. Motor: Strength is 5/5 in the bilateral upper and lower extremities.  There is no weakness of the intrinsic musculature of the hands.  There is no wasting of the intrinsic muscle of the hands.  She does have pain with radial deviation of the wrist.  Shoulder shrug is equal bilaterally.  There is no pronator drift.  There are no fasciculations noted. DTR's: Deep tendon reflexes are 2/4 at the bilateral biceps, triceps, brachioradialis, patella and achilles.  Plantar responses are downgoing bilaterally. Gait and Station: The patient is able to ambulate without difficulty.   MOVEMENT EXAM: Tremor: None  Lab work was done on June 23, 2019.  White blood cells were 6.4, hemoglobin 11.8, hematocrit 35.8 and platelets 262.  Sodium was 140, potassium 4.2, chloride 107, CO2 27, BUN 10, creatinine 0.66, glucose 67, AST 18, ALT 12, alkaline phosphatase 69, TSH 3.77.     Assessment/Plan:   1.  Hand pain  -Patient seems to have either carpal tunnel or de Quervain's tenosynovitis.  While these are  quite different, she has some sensory changes with pinprick that would suggest carpal tunnel, but her history really suggest de Quervain's.  I gave her several options and ultimately she decided to go ahead and proceed with EMG.  If that is negative, we will refer her to hand orthopedics.  She was agreeable to that approach.  CC:  Wenda Low, MD

## 2019-10-15 DIAGNOSIS — N920 Excessive and frequent menstruation with regular cycle: Secondary | ICD-10-CM | POA: Insufficient documentation

## 2019-10-15 DIAGNOSIS — N926 Irregular menstruation, unspecified: Secondary | ICD-10-CM | POA: Insufficient documentation

## 2019-10-15 DIAGNOSIS — J301 Allergic rhinitis due to pollen: Secondary | ICD-10-CM | POA: Insufficient documentation

## 2019-10-16 ENCOUNTER — Encounter: Payer: Self-pay | Admitting: Neurology

## 2019-10-16 ENCOUNTER — Other Ambulatory Visit: Payer: Self-pay

## 2019-10-16 ENCOUNTER — Ambulatory Visit (INDEPENDENT_AMBULATORY_CARE_PROVIDER_SITE_OTHER): Payer: Medicaid Other | Admitting: Neurology

## 2019-10-16 VITALS — BP 132/86 | HR 80 | Resp 16 | Ht 61.0 in | Wt 167.8 lb

## 2019-10-16 DIAGNOSIS — M79641 Pain in right hand: Secondary | ICD-10-CM

## 2019-10-16 NOTE — Patient Instructions (Signed)
ELECTROMYOGRAM AND NERVE CONDUCTION STUDIES (EMG/NCS) INSTRUCTIONS  How to Prepare The neurologist conducting the EMG will need to know if you have certain medical conditions. Tell the neurologist and other EMG lab personnel if you: . Have a pacemaker or any other electrical medical device . Take blood-thinning medications . Have hemophilia, a blood-clotting disorder that causes prolonged bleeding Bathing Take a shower or bath shortly before your exam in order to remove oils from your skin. Don't apply lotions or creams before the exam.  What to Expect You'll likely be asked to change into a hospital gown for the procedure and lie down on an examination table. The following explanations can help you understand what will happen during the exam.  . Electrodes. The neurologist or a technician places surface electrodes at various locations on your skin depending on where you're experiencing symptoms. Or the neurologist may insert needle electrodes at different sites depending on your symptoms.  . Sensations. The electrodes will at times transmit a tiny electrical current that you may feel as a twinge or spasm. The needle electrode may cause discomfort or pain that usually ends shortly after the needle is removed. If you are concerned about discomfort or pain, you may want to talk to the neurologist about taking a short break during the exam.  . Instructions. During the needle EMG, the neurologist will assess whether there is any spontaneous electrical activity when the muscle is at rest - activity that isn't present in healthy muscle tissue - and the degree of activity when you slightly contract the muscle.  He or she will give you instructions on resting and contracting a muscle at appropriate times. Depending on what muscles and nerves the neurologist is examining, he or she may ask you to change positions during the exam.  After your EMG You may experience some temporary, minor bruising where the  needle electrode was inserted into your muscle. This bruising should fade within several days. If it persists, contact your primary care doctor.    

## 2019-11-03 ENCOUNTER — Ambulatory Visit: Payer: Medicaid Other | Admitting: Neurology

## 2019-11-10 ENCOUNTER — Encounter: Payer: Medicaid Other | Admitting: Neurology

## 2019-12-11 ENCOUNTER — Ambulatory Visit: Payer: Medicaid Other | Attending: Internal Medicine

## 2019-12-11 DIAGNOSIS — Z20822 Contact with and (suspected) exposure to covid-19: Secondary | ICD-10-CM

## 2019-12-12 LAB — NOVEL CORONAVIRUS, NAA: SARS-CoV-2, NAA: DETECTED — AB

## 2020-07-04 ENCOUNTER — Encounter (HOSPITAL_COMMUNITY): Payer: Self-pay | Admitting: Emergency Medicine

## 2020-07-04 ENCOUNTER — Emergency Department (HOSPITAL_COMMUNITY)
Admission: EM | Admit: 2020-07-04 | Discharge: 2020-07-04 | Disposition: A | Discharge status: Home / Self Care | Payer: Medicaid Other | Attending: Emergency Medicine | Admitting: Emergency Medicine

## 2020-07-04 ENCOUNTER — Other Ambulatory Visit: Payer: Self-pay

## 2020-07-04 DIAGNOSIS — Z5321 Procedure and treatment not carried out due to patient leaving prior to being seen by health care provider: Secondary | ICD-10-CM | POA: Insufficient documentation

## 2020-07-04 DIAGNOSIS — R102 Pelvic and perineal pain: Secondary | ICD-10-CM | POA: Diagnosis not present

## 2020-07-04 NOTE — ED Notes (Signed)
Patient did not answer when call for a room

## 2020-07-04 NOTE — ED Triage Notes (Signed)
Pt. Stated, I pulled out my tampon and it scraped that area and its still swelling and hurts. I went to [facility] after hours and they did all the Sexual Contact Test and all was negative and told me to go home and sit in the bath and no help, it keeps swelling.

## 2020-07-04 NOTE — ED Notes (Signed)
Pt called x2 to bring back to room no answer.

## 2020-11-24 ENCOUNTER — Other Ambulatory Visit: Payer: Medicaid Other

## 2020-11-24 DIAGNOSIS — Z20822 Contact with and (suspected) exposure to covid-19: Secondary | ICD-10-CM

## 2020-11-26 LAB — NOVEL CORONAVIRUS, NAA: SARS-CoV-2, NAA: NOT DETECTED

## 2020-11-26 LAB — SARS-COV-2, NAA 2 DAY TAT

## 2021-11-20 ENCOUNTER — Other Ambulatory Visit: Payer: Self-pay

## 2021-11-20 ENCOUNTER — Ambulatory Visit
Admission: RE | Admit: 2021-11-20 | Discharge: 2021-11-20 | Disposition: A | Discharge status: Home / Self Care | Payer: Medicaid Other | Source: Ambulatory Visit | Attending: Internal Medicine | Admitting: Internal Medicine

## 2021-11-20 VITALS — BP 117/81 | HR 107 | Temp 99.8°F | Resp 18

## 2021-11-20 DIAGNOSIS — J069 Acute upper respiratory infection, unspecified: Secondary | ICD-10-CM

## 2021-11-20 DIAGNOSIS — H65192 Other acute nonsuppurative otitis media, left ear: Secondary | ICD-10-CM

## 2021-11-20 MED ORDER — BENZONATATE 100 MG PO CAPS: 100.0000 mg | ORAL_CAPSULE | Freq: Three times a day (TID) | ORAL | 0 refills | Status: DC | PRN

## 2021-11-20 MED ORDER — AMOXICILLIN 875 MG PO TABS: 875.0000 mg | ORAL_TABLET | Freq: Two times a day (BID) | ORAL | 0 refills | Status: AC

## 2021-11-20 NOTE — Discharge Instructions (Signed)
It appears that you have a viral upper respiratory infection that should resolve in the next few days with symptomatic treatment.  Your COVID-19 and flu test is pending.  We will call if it is positive.  You also have an ear infection which is being treated with amoxicillin antibiotic.

## 2021-11-20 NOTE — ED Triage Notes (Signed)
Pt c/o fever at home, chills, earache, headache, diarrhea,   Denies cough, sore throat, nausea, vomiting, constipation  Onset ~ Friday

## 2021-11-20 NOTE — ED Provider Notes (Signed)
[name]    CSN: [id number] Arrival date & time: 11/20/21  1210      History   Chief Complaint Chief Complaint  Patient presents with   body chills    HPI Vanessa Collier is a 30 y.o. female.   Patient presents with fever, chills, headache, diarrhea, nasal congestion, left ear pain that started approximately 4 days ago.  T-max at home is unknown.  Patient's child has had similar symptoms recently.  Patient has taken over-the-counter Mucinex and cold and flu medication with minimal improvement in symptoms.  Denies chest pain, shortness of breath, cough, nausea, vomiting, abdominal pain.    Past Medical History:  Diagnosis Date   Chronic kidney disease    Hypertension    Lupus (HCC)    Lupus (HCC)    Migraines     Patient Active Problem List   Diagnosis Date Noted   Acute seasonal allergic rhinitis due to pollen 10/15/2019   Irregular periods 10/15/2019   Menorrhagia 10/15/2019   Pre-eclampsia 02/14/2019   Hypothyroidism 02/13/2019   Essential (primary) hypertension 02/13/2019   Migraine without aura and without status migrainosus, not intractable 02/13/2019   Anemia 02/13/2019   High risk pregnancy in young multigravida, third trimester 02/13/2019   Preeclampsia, severe, third trimester 02/13/2019   Lupus nephritis (HCC) 09/10/2018   Supervision of high-risk pregnancy 09/10/2018    Past Surgical History:  Procedure Laterality Date   CESAREAN SECTION N/A 02/14/2019   Procedure: CESAREAN SECTION;  Surgeon: Jaymes Graff, MD;  Location: [facility];  Service: Obstetrics;  Laterality: N/A;   TONSILLECTOMY      OB History     Gravida  3   Para      Term      Preterm      AB  2   Living         SAB      IAB  2   Ectopic      Multiple      Live Births               Home Medications    Prior to Admission medications   Medication Sig Start Date End Date Taking? Authorizing Provider  amoxicillin (AMOXIL) 875 MG tablet Take  1 tablet (875 mg total) by mouth 2 (two) times daily for 10 days. 11/20/21 11/30/21 Yes Wanisha Shiroma, Acie Fredrickson, FNP  benzonatate (TESSALON) 100 MG capsule Take 1 capsule (100 mg total) by mouth every 8 (eight) hours as needed for cough. 11/20/21  Yes Cassiel Fernandez, Rolly Salter E, FNP  acetaminophen (TYLENOL) 500 MG tablet Take 2 tablets (1,000 mg total) by mouth every 8 (eight) hours as needed for moderate pain. 02/17/19   Geryl Rankins, MD  Fe Fum-FePoly-Vit C-Vit B3 (INTEGRA) 62.5-62.5-40-3 MG CAPS TAKE 1 CAPSULE BETWEEN MEALS ONCE A DAY ORALLY 30 12/22/18   [provider]  hydroxychloroquine (PLAQUENIL) 200 MG tablet Take 200 mg by mouth daily. 01/19/19   [provider]  labetalol (NORMODYNE) 200 MG tablet Take 200 mg by mouth 2 (two) times daily.  08/26/18   [provider]  levothyroxine (SYNTHROID, LEVOTHROID) 150 MCG tablet Take 150 mcg by mouth daily before breakfast.    [provider]  oxyCODONE (OXY IR/ROXICODONE) 5 MG immediate release tablet Take 1 tablet (5 mg total) by mouth every 4 (four) hours as needed for moderate pain or severe pain. 02/17/19   Geryl Rankins, MD  Prenatal Vit-Fe Fumarate-FA (PRENATAL MULTIVITAMIN) TABS tablet Take 1 tablet  by mouth daily at 12 noon.    [provider]    Family History Family History  Problem Relation Age of Onset   Uterine cancer Mother     Social History Social History   Tobacco Use   Smoking status: Never   Smokeless tobacco: Never  Vaping Use   Vaping Use: Never used  Substance Use Topics   Alcohol use: No   Drug use: No     Allergies   Patient has no known allergies.   Review of Systems Review of Systems Per HPI  Physical Exam Triage Vital Signs ED Triage Vitals  Enc Vitals Group     BP 11/20/21 1241 117/81     Pulse Rate 11/20/21 1241 (!) 107     Resp 11/20/21 1241 18     Temp 11/20/21 1241 99.8 F (37.7 C)     Temp Source 11/20/21 1241 Oral     SpO2 11/20/21 1241 98 %     Weight --       Height --      Head Circumference --      Peak Flow --      Pain Score 11/20/21 1242 0     Pain Loc --      Pain Edu? --      Excl. in [place]? --    No data found.  Updated Vital Signs BP 117/81 (BP Location: Right Arm)    Pulse (!) 107    Temp 99.8 F (37.7 C) (Oral)    Resp 18    SpO2 98%   Visual Acuity Right Eye Distance:   Left Eye Distance:   Bilateral Distance:    Right Eye Near:   Left Eye Near:    Bilateral Near:     Physical Exam Constitutional:      General: She is not in acute distress.    Appearance: Normal appearance. She is not toxic-appearing or diaphoretic.  HENT:     Head: Normocephalic and atraumatic.     Right Ear: Ear canal normal. A middle ear effusion is present.     Left Ear: Ear canal normal. No drainage, swelling or tenderness.  No middle ear effusion. No mastoid tenderness. Tympanic membrane is erythematous. Tympanic membrane is not perforated or bulging.     Nose: Congestion present.     Mouth/Throat:     Mouth: Mucous membranes are moist.     Pharynx: No posterior oropharyngeal erythema.  Eyes:     Extraocular Movements: Extraocular movements intact.     Conjunctiva/sclera: Conjunctivae normal.     Pupils: Pupils are equal, round, and reactive to light.  Cardiovascular:     Rate and Rhythm: Normal rate and regular rhythm.     Pulses: Normal pulses.     Heart sounds: Normal heart sounds.  Pulmonary:     Effort: Pulmonary effort is normal. No respiratory distress.     Breath sounds: Normal breath sounds. No stridor. No wheezing, rhonchi or rales.  Abdominal:     General: Abdomen is flat. Bowel sounds are normal.     Palpations: Abdomen is soft.  Musculoskeletal:        General: Normal range of motion.     Cervical back: Normal range of motion.  Skin:    General: Skin is warm and dry.  Neurological:     General: No focal deficit present.     Mental Status: She is alert and oriented to person, place, and time. Mental status is at  baseline.  Psychiatric:        Mood and Affect: Mood normal.        Behavior: Behavior normal.     UC Treatments / Results  Labs (all labs ordered are listed, but only abnormal results are displayed) Labs Reviewed  COVID-19, FLU A+B NAA    EKG   Radiology No results found.  Procedures Procedures (including critical care time)  Medications Ordered in UC Medications - No data to display  Initial Impression / Assessment and Plan / UC Course  I have reviewed the triage vital signs and the nursing notes.  Pertinent labs & imaging results that were available during my care of the patient were reviewed by me and considered in my medical decision making (see chart for details).     Patient presents with symptoms likely from a viral upper respiratory infection. Differential includes bacterial pneumonia, sinusitis, allergic rhinitis, COVID-19, flu. Do not suspect underlying cardiopulmonary process. Symptoms seem unlikely related to ACS, CHF or COPD exacerbations, pneumonia, pneumothorax. Patient is nontoxic appearing and not in need of emergent medical intervention.  COVID-19 and flu test pending.  Recommended symptom control with over the counter medications.  Amoxicillin to treat left ear infection.  Return if symptoms fail to improve in 1-2 weeks or you develop shortness of breath, chest pain, severe headache. Patient states understanding and is agreeable.  Discharged with PCP followup.  Final Clinical Impressions(s) / UC Diagnoses   Final diagnoses:  Other non-recurrent acute nonsuppurative otitis media of left ear  Viral upper respiratory tract infection with cough     Discharge Instructions      It appears that you have a viral upper respiratory infection that should resolve in the next few days with symptomatic treatment.  Your COVID-19 and flu test is pending.  We will call if it is positive.  You also have an ear infection which is being treated with amoxicillin  antibiotic.    ED Prescriptions     Medication Sig Dispense Auth. Provider   amoxicillin (AMOXIL) 875 MG tablet Take 1 tablet (875 mg total) by mouth 2 (two) times daily for 10 days. 20 tablet [place], [place] E, [place]   benzonatate (TESSALON) 100 MG capsule Take 1 capsule (100 mg total) by mouth every 8 (eight) hours as needed for cough. 21 capsule [place], Michele Rockers, [place]      PDMP not reviewed this encounter.   Teodora Medici, [place] 11/20/21 1310

## 2021-11-21 LAB — COVID-19, FLU A+B NAA
Influenza A, NAA: NOT DETECTED
Influenza B, NAA: NOT DETECTED
SARS-CoV-2, NAA: NOT DETECTED

## 2022-11-03 ENCOUNTER — Encounter: Payer: Self-pay | Admitting: Emergency Medicine

## 2022-11-03 ENCOUNTER — Other Ambulatory Visit: Payer: Self-pay

## 2022-11-03 ENCOUNTER — Ambulatory Visit
Admission: EM | Admit: 2022-11-03 | Discharge: 2022-11-03 | Disposition: A | Discharge status: Home / Self Care | Payer: Medicaid Other | Attending: Physician Assistant | Admitting: Physician Assistant

## 2022-11-03 DIAGNOSIS — U071 COVID-19: Secondary | ICD-10-CM

## 2022-11-03 DIAGNOSIS — H65191 Other acute nonsuppurative otitis media, right ear: Secondary | ICD-10-CM | POA: Diagnosis not present

## 2022-11-03 MED ORDER — AMOXICILLIN 500 MG PO CAPS: 500.0000 mg | ORAL_CAPSULE | Freq: Three times a day (TID) | ORAL | 0 refills | Status: DC

## 2022-11-03 NOTE — ED Provider Notes (Signed)
[name]    CSN: [id number] Arrival date & time: 11/03/22  0840      History   Chief Complaint Chief Complaint  Patient presents with   Nasal Congestion   Otalgia    HPI Vanessa Collier is a 31 y.o. female.   Patient here today for evaluation of nasal congestion and right ear pain that started over the last few days.  She states she tested positive for COVID this morning.  She has not had fever.  She denies any sore throat or cough.  She has tried over-the-counter medication with mild relief.  She states that typically when she has a respiratory infection she will develop ear infection as well.  She does report some sinus pressure but only to the right side of her face.  The history is provided by the patient.  Otalgia Associated symptoms: congestion   Associated symptoms: no abdominal pain, no cough, no diarrhea, no fever, no sore throat and no vomiting     Past Medical History:  Diagnosis Date   Chronic kidney disease    Hypertension    Lupus ([place])    Lupus ([place])    Migraines     Patient Active Problem List   Diagnosis Date Noted   Acute seasonal allergic rhinitis due to pollen 10/15/2019   Irregular periods 10/15/2019   Menorrhagia 10/15/2019   Pre-eclampsia 02/14/2019   Hypothyroidism 02/13/2019   Essential (primary) hypertension 02/13/2019   Migraine without aura and without status migrainosus, not intractable 02/13/2019   Anemia 02/13/2019   High risk pregnancy in young multigravida, third trimester 02/13/2019   Preeclampsia, severe, third trimester 02/13/2019   Lupus nephritis ([place]) 09/10/2018   Supervision of high-risk pregnancy 09/10/2018    Past Surgical History:  Procedure Laterality Date   CESAREAN SECTION N/A 02/14/2019   Procedure: CESAREAN SECTION;  Surgeon: Crawford Givens, MD;  Location: [facility];  Service: Obstetrics;  Laterality: N/A;   TONSILLECTOMY      OB History     Gravida  3   Para      Term      Preterm       AB  2   Living         SAB      IAB  2   Ectopic      Multiple      Live Births               Home Medications    Prior to Admission medications   Medication Sig Start Date End Date Taking? Authorizing Provider  amoxicillin (AMOXIL) 500 MG capsule Take 1 capsule (500 mg total) by mouth 3 (three) times daily. 11/03/22  Yes Francene Finders, PA-C  acetaminophen (TYLENOL) 500 MG tablet Take 2 tablets (1,000 mg total) by mouth every 8 (eight) hours as needed for moderate pain. 02/17/19   Thurnell Lose, MD  benzonatate (TESSALON) 100 MG capsule Take 1 capsule (100 mg total) by mouth every 8 (eight) hours as needed for cough. 11/20/21   Teodora Medici, FNP  Fe Fum-FePoly-Vit C-Vit B3 (INTEGRA) 62.5-62.5-40-3 MG CAPS TAKE 1 CAPSULE BETWEEN MEALS ONCE A DAY ORALLY 30 12/22/18   [provider]  hydroxychloroquine (PLAQUENIL) 200 MG tablet Take 200 mg by mouth daily. 01/19/19   [provider]  labetalol (NORMODYNE) 200 MG tablet Take 200 mg by mouth 2 (two) times daily.  08/26/18   [provider]  levothyroxine (SYNTHROID, LEVOTHROID) 150 MCG tablet Take  150 mcg by mouth daily before breakfast.    [provider]  oxyCODONE (OXY IR/ROXICODONE) 5 MG immediate release tablet Take 1 tablet (5 mg total) by mouth every 4 (four) hours as needed for moderate pain or severe pain. 02/17/19   Geryl Rankins, MD  Prenatal Vit-Fe Fumarate-FA (PRENATAL MULTIVITAMIN) TABS tablet Take 1 tablet by mouth daily at 12 noon.    [provider]    Family History Family History  Problem Relation Age of Onset   Uterine cancer Mother     Social History Social History   Tobacco Use   Smoking status: Never   Smokeless tobacco: Never  Vaping Use   Vaping Use: Never used  Substance Use Topics   Alcohol use: No   Drug use: No     Allergies   Patient has no known allergies.   Review of Systems Review of Systems  Constitutional:  Negative for chills  and fever.  HENT:  Positive for congestion, ear pain and sinus pressure. Negative for sore throat.   Eyes:  Negative for discharge and redness.  Respiratory:  Negative for cough, shortness of breath and wheezing.   Gastrointestinal:  Negative for abdominal pain, diarrhea, nausea and vomiting.     Physical Exam Triage Vital Signs ED Triage Vitals [11/03/22 1118]  Enc Vitals Group     BP 127/88     Pulse Rate 90     Resp 18     Temp 97.8 F (36.6 C)     Temp Source Oral     SpO2 99 %     Weight      Height      Head Circumference      Peak Flow      Pain Score 4     Pain Loc      Pain Edu?      Excl. in [place]?    No data found.  Updated Vital Signs BP 127/88 (BP Location: Left Arm)   Pulse 90   Temp 97.8 F (36.6 C) (Oral)   Resp 18   SpO2 99%      Physical Exam Vitals and nursing note reviewed.  Constitutional:      General: She is not in acute distress.    Appearance: Normal appearance. She is not ill-appearing.  HENT:     Head: Normocephalic and atraumatic.     Left Ear: Tympanic membrane normal.     Ears:     Comments: Right TM erythematous    Nose: Congestion present.     Mouth/Throat:     Mouth: Mucous membranes are moist.     Pharynx: No oropharyngeal exudate or posterior oropharyngeal erythema.  Eyes:     Conjunctiva/sclera: Conjunctivae normal.  Cardiovascular:     Rate and Rhythm: Normal rate and regular rhythm.     Heart sounds: Normal heart sounds. No murmur heard. Pulmonary:     Effort: Pulmonary effort is normal. No respiratory distress.     Breath sounds: Normal breath sounds. No wheezing, rhonchi or rales.  Skin:    General: Skin is warm and dry.  Neurological:     Mental Status: She is alert.  Psychiatric:        Mood and Affect: Mood normal.        Thought Content: Thought content normal.      UC Treatments / Results  Labs (all labs ordered are listed, but only abnormal results are displayed) Labs Reviewed - No data to  display  EKG   Radiology No results found.  Procedures Procedures (including critical care time)  Medications Ordered in UC Medications - No data to display  Initial Impression / Assessment and Plan / UC Course  I have reviewed the triage vital signs and the nursing notes.  Pertinent labs & imaging results that were available during my care of the patient were reviewed by me and considered in my medical decision making (see chart for details).    Patient with positive COVID-19 screening.  Will treat to cover otitis media with amoxicillin and advised over-the-counter symptomatic treatment if needed for COVID symptoms.  Recommended follow-up if no gradual improvement or with any further concerns.  Patient expresses understanding.  Final Clinical Impressions(s) / UC Diagnoses   Final diagnoses:  [id number]  Other acute nonsuppurative otitis media of right ear, recurrence not specified   Discharge Instructions   None    ED Prescriptions     Medication Sig Dispense Auth. Provider   amoxicillin (AMOXIL) 500 MG capsule Take 1 capsule (500 mg total) by mouth 3 (three) times daily. 21 capsule Francene Finders, PA-C      PDMP not reviewed this encounter.   Francene Finders, PA-C 11/03/22 1255

## 2022-11-03 NOTE — ED Triage Notes (Signed)
Pt tested positive for covid this am; pt sts nasal congestion and right ear pain

## 2022-12-02 ENCOUNTER — Ambulatory Visit: Payer: Medicaid Other

## 2022-12-07 ENCOUNTER — Ambulatory Visit
Admission: EM | Admit: 2022-12-07 | Discharge: 2022-12-07 | Disposition: A | Discharge status: Home / Self Care | Payer: Medicaid Other | Attending: Urgent Care | Admitting: Urgent Care

## 2022-12-07 DIAGNOSIS — N3000 Acute cystitis without hematuria: Secondary | ICD-10-CM

## 2022-12-07 DIAGNOSIS — R350 Frequency of micturition: Secondary | ICD-10-CM | POA: Insufficient documentation

## 2022-12-07 LAB — POCT URINALYSIS DIP (MANUAL ENTRY)
Bilirubin, UA: NEGATIVE
Blood, UA: NEGATIVE
Glucose, UA: 100 mg/dL — AB
Ketones, POC UA: NEGATIVE mg/dL
Leukocytes, UA: NEGATIVE
Nitrite, UA: POSITIVE — AB
Protein Ur, POC: 30 mg/dL — AB
Spec Grav, UA: 1.02 (ref 1.010–1.025)
Urobilinogen, UA: 2 E.U./dL — AB
pH, UA: 6 (ref 5.0–8.0)

## 2022-12-07 LAB — POCT URINE PREGNANCY: Preg Test, Ur: NEGATIVE

## 2022-12-07 MED ORDER — CEPHALEXIN 500 MG PO CAPS: 500.0000 mg | ORAL_CAPSULE | Freq: Two times a day (BID) | ORAL | 0 refills | Status: DC

## 2022-12-07 NOTE — ED Provider Notes (Signed)
[facility]  Note:  This document was prepared using Dragon voice recognition software and may include unintentional dictation errors.  MRN: [id number] DOB: 1991-06-10  Subjective:   Vanessa Collier is a 32 y.o. female presenting for 1 day history of acute onset urinary frequency, urgency, dysuria. Holds her urine, drinks sodas and sweet tea every other day. Denies fever, n/v, abdominal pain, pelvic pain, rashes, hematuria, vaginal discharge.  LMP is unknown, takes Depo shots.   No current facility-administered medications for this encounter.  Current Outpatient Medications:    acetaminophen (TYLENOL) 500 MG tablet, Take 2 tablets (1,000 mg total) by mouth every 8 (eight) hours as needed for moderate pain., Disp: 30 tablet, Rfl: 1   amoxicillin (AMOXIL) 500 MG capsule, Take 1 capsule (500 mg total) by mouth 3 (three) times daily. (Patient not taking: Reported on 12/07/2022), Disp: 21 capsule, Rfl: 0   benzonatate (TESSALON) 100 MG capsule, Take 1 capsule (100 mg total) by mouth every 8 (eight) hours as needed for cough. (Patient not taking: Reported on 12/07/2022), Disp: 21 capsule, Rfl: 0   Fe Fum-FePoly-Vit C-Vit B3 (INTEGRA) 62.5-62.5-40-3 MG CAPS, TAKE 1 CAPSULE BETWEEN MEALS ONCE A DAY ORALLY 30, Disp: , Rfl:    hydroxychloroquine (PLAQUENIL) 200 MG tablet, Take 200 mg by mouth daily., Disp: , Rfl:    labetalol (NORMODYNE) 200 MG tablet, Take 200 mg by mouth 2 (two) times daily. , Disp: , Rfl:    levothyroxine (SYNTHROID, LEVOTHROID) 150 MCG tablet, Take 150 mcg by mouth daily before breakfast., Disp: , Rfl:    oxyCODONE (OXY IR/ROXICODONE) 5 MG immediate release tablet, Take 1 tablet (5 mg total) by mouth every 4 (four) hours as needed for moderate pain or severe pain., Disp: 30 tablet, Rfl: 0   Prenatal Vit-Fe Fumarate-FA (PRENATAL MULTIVITAMIN) TABS tablet, Take 1 tablet by mouth daily at 12 noon., Disp: , Rfl:    No Known Allergies  Past Medical History:  Diagnosis Date    Chronic kidney disease    Hypertension    Lupus ([place])    Lupus ([place])    Migraines      Past Surgical History:  Procedure Laterality Date   CESAREAN SECTION N/A 02/14/2019   Procedure: CESAREAN SECTION;  Surgeon: Crawford Givens, MD;  Location: [facility];  Service: Obstetrics;  Laterality: N/A;   TONSILLECTOMY      Family History  Problem Relation Age of Onset   Uterine cancer Mother     Social History   Tobacco Use   Smoking status: Never   Smokeless tobacco: Never  Vaping Use   Vaping Use: Never used  Substance Use Topics   Alcohol use: No   Drug use: No    ROS   Objective:   Vitals: BP 116/82   Pulse 94   Temp 98.1 F (36.7 C)   Resp 19   SpO2 98%   Physical Exam Constitutional:      General: She is not in acute distress.    Appearance: Normal appearance. She is well-developed. She is not ill-appearing, toxic-appearing or diaphoretic.  HENT:     Head: Normocephalic and atraumatic.     Nose: Nose normal.     Mouth/Throat:     Mouth: Mucous membranes are moist.  Eyes:     General: No scleral icterus.       Right eye: No discharge.        Left eye: No discharge.     Extraocular Movements: Extraocular movements intact.  Conjunctiva/sclera: Conjunctivae normal.  Cardiovascular:     Rate and Rhythm: Normal rate.  Pulmonary:     Effort: Pulmonary effort is normal.  Abdominal:     General: Bowel sounds are normal. There is no distension.     Palpations: Abdomen is soft. There is no mass.     Tenderness: There is no abdominal tenderness. There is no right CVA tenderness, left CVA tenderness, guarding or rebound.  Skin:    General: Skin is warm and dry.  Neurological:     General: No focal deficit present.     Mental Status: She is alert and oriented to person, place, and time.  Psychiatric:        Mood and Affect: Mood normal.        Behavior: Behavior normal.        Thought Content: Thought content normal.        Judgment: Judgment normal.      Results for orders placed or performed during the hospital encounter of 12/07/22 (from the past 24 hour(s))  POCT urinalysis dipstick     Status: Abnormal   Collection Time: 12/07/22  7:04 PM  Result Value Ref Range   Color, UA orange (A) yellow   Clarity, UA clear clear   Glucose, UA =100 (A) negative mg/dL   Bilirubin, UA negative negative   Ketones, POC UA negative negative mg/dL   Spec Grav, UA 1.020 1.010 - 1.025   Blood, UA negative negative   pH, UA 6.0 5.0 - 8.0   Protein Ur, POC =30 (A) negative mg/dL   Urobilinogen, UA 2.0 (A) 0.2 or 1.0 E.U./dL   Nitrite, UA Positive (A) Negative   Leukocytes, UA Negative Negative  POCT urine pregnancy     Status: None   Collection Time: 12/07/22  7:05 PM  Result Value Ref Range   Preg Test, Ur Negative Negative    Assessment and Plan :   PDMP not reviewed this encounter.  1. Acute cystitis without hematuria   2. Urinary frequency     Start Keflex to cover for acute cystitis, urine culture pending.  Recommended aggressive hydration, limiting urinary irritants. Counseled patient on potential for adverse effects with medications prescribed/recommended today, ER and return-to-clinic precautions discussed, patient verbalized understanding.    Jaynee Eagles, [place] 12/07/22 1913

## 2022-12-07 NOTE — Discharge Instructions (Signed)
Please start Keflex to address an urinary tract infection. Make sure you hydrate very well with plain water and a quantity of 64-80 ounces of water a day.  Please limit drinks that are considered urinary irritants such as soda, sweet tea, coffee, energy drinks, alcohol.  These can worsen your urinary and genital symptoms but also be the source of them.  I will let you know about your urine culture results through MyChart to see if we need to prescribe or change your antibiotics based off of those results.  

## 2022-12-07 NOTE — ED Triage Notes (Addendum)
Pt presents to [facility] with urinary frequency, and pelvic pain for one day pt is concerned for uti. Pt reports no vaginal discharge. Pt reports azo medication otc

## 2022-12-08 LAB — URINE CULTURE: Culture: NO GROWTH

## 2022-12-10 ENCOUNTER — Ambulatory Visit: Payer: Self-pay

## 2023-02-09 ENCOUNTER — Ambulatory Visit: Payer: Self-pay

## 2023-02-15 ENCOUNTER — Ambulatory Visit
Admission: EM | Admit: 2023-02-15 | Discharge: 2023-02-15 | Disposition: A | Discharge status: Home / Self Care | Payer: Medicaid Other

## 2023-02-15 DIAGNOSIS — H6591 Unspecified nonsuppurative otitis media, right ear: Secondary | ICD-10-CM

## 2023-02-15 MED ORDER — FLUTICASONE PROPIONATE 50 MCG/ACT NA SUSP: 1.0000 | Freq: Every day | NASAL | 0 refills | Status: AC

## 2023-02-15 MED ORDER — CETIRIZINE HCL 10 MG PO TABS: 10.0000 mg | ORAL_TABLET | Freq: Every day | ORAL | 0 refills | Status: DC

## 2023-02-15 NOTE — Discharge Instructions (Signed)
You have fluid behind your eardrum in your right ear.  I have prescribed cetirizine antihistamine and a nasal spray to alleviate this.  Please follow-up if any symptoms persist or worsen.

## 2023-02-15 NOTE — ED Triage Notes (Signed)
Pt presents to uc with co of right ear pain for one week. No otc meds

## 2023-02-15 NOTE — ED Provider Notes (Signed)
[name]    CSN: [id number] Arrival date & time: 02/15/23  0801      History   Chief Complaint Chief Complaint  Patient presents with   Otalgia    HPI Vanessa Collier is a 32 y.o. female.   Patient presents with right ear pain that has been present for about 1 week.  Patient denies trauma, foreign body, decreased hearing, drainage from the ear.  Patient does report that she has had a little bit of nasal drainage as well.  Denies any associated fever.  Has not taken any medications to alleviate symptoms.   Otalgia   Past Medical History:  Diagnosis Date   Chronic kidney disease    Hypertension    Lupus ([place])    Lupus ([place])    Migraines     Patient Active Problem List   Diagnosis Date Noted   Acute seasonal allergic rhinitis due to pollen 10/15/2019   Irregular periods 10/15/2019   Menorrhagia 10/15/2019   Pre-eclampsia 02/14/2019   Hypothyroidism 02/13/2019   Essential (primary) hypertension 02/13/2019   Migraine without aura and without status migrainosus, not intractable 02/13/2019   Anemia 02/13/2019   High risk pregnancy in young multigravida, third trimester 02/13/2019   Preeclampsia, severe, third trimester 02/13/2019   Lupus nephritis ([place]) 09/10/2018   Supervision of high-risk pregnancy 09/10/2018    Past Surgical History:  Procedure Laterality Date   CESAREAN SECTION N/A 02/14/2019   Procedure: CESAREAN SECTION;  Surgeon: Crawford Givens, MD;  Location: [facility] LD [facility];  Service: Obstetrics;  Laterality: N/A;   TONSILLECTOMY      OB History     Gravida  3   Para      Term      Preterm      AB  2   Living         SAB      IAB  2   Ectopic      Multiple      Live Births               Home Medications    Prior to Admission medications   Medication Sig Start Date End Date Taking? Authorizing Provider  cetirizine (ZYRTEC) 10 MG tablet Take 1 tablet (10 mg total) by mouth daily. 02/15/23  Yes Ha Placeres, Hildred Alamin E, FNP   fluticasone (FLONASE) 50 MCG/ACT nasal spray Place 1 spray into both nostrils daily. 02/15/23  Yes [place] Serpe, Hildred Alamin E, FNP  acetaminophen (TYLENOL) 500 MG tablet Take 2 tablets (1,000 mg total) by mouth every 8 (eight) hours as needed for moderate pain. 02/17/19   Thurnell Lose, MD  amoxicillin (AMOXIL) 500 MG capsule Take 1 capsule (500 mg total) by mouth 3 (three) times daily. Patient not taking: Reported on 12/07/2022 11/03/22   Francene Finders, PA-C  benzonatate (TESSALON) 100 MG capsule Take 1 capsule (100 mg total) by mouth every 8 (eight) hours as needed for cough. Patient not taking: Reported on 12/07/2022 11/20/21   Teodora Medici, FNP  cephALEXin (KEFLEX) 500 MG capsule Take 1 capsule (500 mg total) by mouth 2 (two) times daily. 12/07/22   Jaynee Eagles, PA-C  Fe Fum-FePoly-Vit C-Vit B3 (INTEGRA) 62.5-62.5-40-3 MG CAPS TAKE 1 CAPSULE BETWEEN MEALS ONCE A DAY ORALLY 30 12/22/18   [provider]  hydroxychloroquine (PLAQUENIL) 200 MG tablet Take 200 mg by mouth daily. 01/19/19   [provider]  labetalol (NORMODYNE) 200 MG tablet Take 200 mg by mouth 2 (two) times daily.  08/26/18   [provider]  levothyroxine (SYNTHROID, LEVOTHROID) 150 MCG tablet Take 150 mcg by mouth daily before breakfast.    [provider]  lisinopril (ZESTRIL) 20 MG tablet Take 20 mg by mouth daily.    [provider]  oxyCODONE (OXY IR/ROXICODONE) 5 MG immediate release tablet Take 1 tablet (5 mg total) by mouth every 4 (four) hours as needed for moderate pain or severe pain. 02/17/19   Thurnell Lose, MD  Prenatal Vit-Fe Fumarate-FA (PRENATAL MULTIVITAMIN) TABS tablet Take 1 tablet by mouth daily at 12 noon.    [provider]    Family History Family History  Problem Relation Age of Onset   Uterine cancer Mother     Social History Social History   Tobacco Use   Smoking status: Never   Smokeless tobacco: Never  Vaping Use   Vaping Use: Never used   Substance Use Topics   Alcohol use: No   Drug use: No     Allergies   Patient has no known allergies.   Review of Systems Review of Systems Per HPI  Physical Exam Triage Vital Signs ED Triage Vitals  Enc Vitals Group     BP 02/15/23 0813 (!) 136/92     Pulse Rate 02/15/23 0813 85     Resp 02/15/23 0813 17     Temp 02/15/23 0813 98 F (36.7 C)     Temp src --      SpO2 02/15/23 0813 98 %     Weight --      Height --      Head Circumference --      Peak Flow --      Pain Score 02/15/23 0812 7     Pain Loc --      Pain Edu? --      Excl. in [place]? --    No data found.  Updated Vital Signs BP (!) 136/92   Pulse 85   Temp 98 F (36.7 C)   Resp 17   SpO2 98%   Visual Acuity Right Eye Distance:   Left Eye Distance:   Bilateral Distance:    Right Eye Near:   Left Eye Near:    Bilateral Near:     Physical Exam Constitutional:      General: She is not in acute distress.    Appearance: Normal appearance. She is not toxic-appearing or diaphoretic.  HENT:     Head: Normocephalic and atraumatic.     Right Ear: Ear canal normal. No drainage, swelling or tenderness. A middle ear effusion is present. No mastoid tenderness. Tympanic membrane is not perforated, erythematous or bulging.  Skin:    General: Skin is warm and dry.  Neurological:     General: No focal deficit present.     Mental Status: She is alert and oriented to person, place, and time. Mental status is at baseline.  Psychiatric:        Mood and Affect: Mood normal.        Behavior: Behavior normal.      UC Treatments / Results  Labs (all labs ordered are listed, but only abnormal results are displayed) Labs Reviewed - No data to display  EKG   Radiology No results found.  Procedures Procedures (including critical care time)  Medications Ordered in UC Medications - No data to display  Initial Impression / Assessment and Plan / UC Course  I have reviewed the triage vital signs and the  nursing notes.  Pertinent labs & imaging results that were available during my care of the patient were reviewed by me and considered in my medical decision making (see chart for details).     Patient has fluid behind TM which is most likely etiology of patient's symptoms.  Will treat with antihistamine and Flonase as patient denies that she takes these daily so that should be safe.  Advised follow-up if symptoms persist or worsen.  Patient verbalized understanding and was agreeable with plan. Final Clinical Impressions(s) / UC Diagnoses   Final diagnoses:  Fluid level behind tympanic membrane of right ear     Discharge Instructions      You have fluid behind your eardrum in your right ear.  I have prescribed cetirizine antihistamine and a nasal spray to alleviate this.  Please follow-up if any symptoms persist or worsen.    ED Prescriptions     Medication Sig Dispense Auth. Provider   cetirizine (ZYRTEC) 10 MG tablet Take 1 tablet (10 mg total) by mouth daily. 30 tablet [place], [place] E, [place]   fluticasone [facility]) 50 MCG/ACT nasal spray Place 1 spray into both nostrils daily. 16 g Teodora Medici, [place]      PDMP not reviewed this encounter.   Teodora Medici, [place] 02/15/23 [phone]

## 2023-04-13 ENCOUNTER — Ambulatory Visit: Payer: Self-pay

## 2023-12-20 DIAGNOSIS — Z01419 Encounter for gynecological examination (general) (routine) without abnormal findings: Secondary | ICD-10-CM | POA: Diagnosis not present

## 2023-12-20 DIAGNOSIS — Z3042 Encounter for surveillance of injectable contraceptive: Secondary | ICD-10-CM | POA: Diagnosis not present

## 2024-01-17 DIAGNOSIS — Z3042 Encounter for surveillance of injectable contraceptive: Secondary | ICD-10-CM | POA: Diagnosis not present

## 2024-01-28 DIAGNOSIS — G43009 Migraine without aura, not intractable, without status migrainosus: Secondary | ICD-10-CM | POA: Diagnosis not present

## 2024-01-28 DIAGNOSIS — E039 Hypothyroidism, unspecified: Secondary | ICD-10-CM | POA: Diagnosis not present

## 2024-01-28 DIAGNOSIS — J301 Allergic rhinitis due to pollen: Secondary | ICD-10-CM | POA: Diagnosis not present

## 2024-01-28 DIAGNOSIS — I10 Essential (primary) hypertension: Secondary | ICD-10-CM | POA: Diagnosis not present

## 2024-01-28 DIAGNOSIS — M3214 Glomerular disease in systemic lupus erythematosus: Secondary | ICD-10-CM | POA: Diagnosis not present

## 2024-01-28 DIAGNOSIS — F419 Anxiety disorder, unspecified: Secondary | ICD-10-CM | POA: Diagnosis not present

## 2024-04-03 DIAGNOSIS — M7918 Myalgia, other site: Secondary | ICD-10-CM | POA: Diagnosis not present

## 2024-04-13 DIAGNOSIS — I10 Essential (primary) hypertension: Secondary | ICD-10-CM | POA: Diagnosis not present

## 2024-04-13 DIAGNOSIS — E039 Hypothyroidism, unspecified: Secondary | ICD-10-CM | POA: Diagnosis not present

## 2024-04-13 DIAGNOSIS — Z3042 Encounter for surveillance of injectable contraceptive: Secondary | ICD-10-CM | POA: Diagnosis not present

## 2024-04-13 DIAGNOSIS — G43009 Migraine without aura, not intractable, without status migrainosus: Secondary | ICD-10-CM | POA: Diagnosis not present

## 2024-04-13 DIAGNOSIS — M3214 Glomerular disease in systemic lupus erythematosus: Secondary | ICD-10-CM | POA: Diagnosis not present

## 2024-04-13 DIAGNOSIS — F411 Generalized anxiety disorder: Secondary | ICD-10-CM | POA: Diagnosis not present

## 2024-04-19 ENCOUNTER — Ambulatory Visit: Admission: EM | Admit: 2024-04-19 | Discharge: 2024-04-19 | Disposition: A | Discharge status: Home / Self Care

## 2024-04-19 ENCOUNTER — Ambulatory Visit: Payer: Self-pay

## 2024-04-19 DIAGNOSIS — K219 Gastro-esophageal reflux disease without esophagitis: Secondary | ICD-10-CM

## 2024-04-19 LAB — POCT URINALYSIS DIP (MANUAL ENTRY)
Bilirubin, UA: NEGATIVE
Blood, UA: NEGATIVE
Glucose, UA: NEGATIVE mg/dL
Ketones, POC UA: NEGATIVE mg/dL
Leukocytes, UA: NEGATIVE
Nitrite, UA: NEGATIVE
Protein Ur, POC: 30 mg/dL — AB
Spec Grav, UA: 1.02 (ref 1.010–1.025)
Urobilinogen, UA: 0.2 U/dL
pH, UA: 7 (ref 5.0–8.0)

## 2024-04-19 LAB — POCT URINE PREGNANCY: Preg Test, Ur: NEGATIVE

## 2024-04-19 MED ORDER — ONDANSETRON 4 MG PO TBDP: 4.0000 mg | ORAL_TABLET | Freq: Three times a day (TID) | ORAL | 0 refills | Status: AC | PRN

## 2024-04-19 MED ORDER — ALUM & MAG HYDROXIDE-SIMETH 200-200-20 MG/5ML PO SUSP
30.0000 mL | Freq: Once | ORAL | Status: AC
  Administered 2024-04-19: 30 mL via ORAL

## 2024-04-19 MED ORDER — FAMOTIDINE 20 MG PO TABS: 20.0000 mg | ORAL_TABLET | Freq: Every evening | ORAL | 0 refills | Status: AC

## 2024-04-19 MED ORDER — FAMOTIDINE 20 MG PO TABS
20.0000 mg | ORAL_TABLET | Freq: Once | ORAL | Status: AC
  Administered 2024-04-19: 20 mg via ORAL

## 2024-04-19 MED ORDER — LIDOCAINE VISCOUS HCL 2 % MT SOLN
15.0000 mL | Freq: Once | OROMUCOSAL | Status: AC
  Administered 2024-04-19: 15 mL via OROMUCOSAL

## 2024-04-19 NOTE — ED Provider Notes (Signed)
 [name]    CSN: [id number] Arrival date & time: 04/19/24  0801      History   Chief Complaint Chief Complaint  Patient presents with   Heartburn   Urinary Urgency    HPI JOELEE Collier is a 33 y.o. female.   Vanessa Collier is a 33 y.o. female that presents with symptoms of acid reflux that started on Friday. She reports a burning sensation in her chest that extends down to her stomach, accompanied by a tingling feeling in her stomach. The patient describes the discomfort as being worse when she needs to urinate, stating it puts "a little bit more pressure" on her at those times. She denies pain or burning during urination and explicitly states she does not have any UTI symptoms. The patient reports feeling nauseated but denies vomiting. She mentions that lying down might make the symptoms "a little bit better," possibly because she falls asleep and doesn't feel it as much. However, she emphasizes that she currently feels the symptoms strongly. The acid reflux is affecting her appetite, making her "not really want to eat." The patient has been taking pantoprazole daily since Friday for this episode, which she states is "not helping." She mentions that she doesn't typically have acid reflux "like that," indicating this is an unusual occurrence for her. The patient denies blood in stool, diarrhea, constipation, sore throat, problems swallowing, or fever. She also states she does not have pain or frequency with urination, only a sense of urgency to "go now" when she needs to urinate. Patient ate [employer] on Thursday and had pizza on Friday.   The following portions of the patient's history were reviewed and updated as appropriate: allergies, current medications, past family history, past medical history, past social history, past surgical history, and problem list.         Past Medical History:  Diagnosis Date   Chronic kidney disease    Hypertension    Lupus     Lupus    Migraines     Patient Active Problem List   Diagnosis Date Noted   Acute seasonal allergic rhinitis due to pollen 10/15/2019   Irregular periods 10/15/2019   Menorrhagia 10/15/2019   Pre-eclampsia 02/14/2019   Hypothyroidism 02/13/2019   Essential (primary) hypertension 02/13/2019   Migraine without aura and without status migrainosus, not intractable 02/13/2019   Anemia 02/13/2019   High risk pregnancy in young multigravida, third trimester 02/13/2019   Preeclampsia, severe, third trimester 02/13/2019   Lupus nephritis ([facility]) 09/10/2018   Supervision of high-risk pregnancy 09/10/2018   Hypothyroidism affecting pregnancy in second trimester 09/10/2018    Past Surgical History:  Procedure Laterality Date   CESAREAN SECTION N/A 02/14/2019   Procedure: CESAREAN SECTION;  Surgeon: Marylu Soda, MD;  Location: [facility];  Service: Obstetrics;  Laterality: N/A;   TONSILLECTOMY      OB History     Gravida  3   Para      Term      Preterm      AB  2   Living         SAB      IAB  2   Ectopic      Multiple      Live Births               Home Medications    Prior to Admission medications   Medication Sig Start Date End Date Taking? Authorizing Provider  aspirin EC  81 MG tablet Take 81 mg by mouth daily. 09/10/18  Yes [provider]  Docosahexaenoic Acid 200 MG CAPS Take by mouth. 09/10/18  Yes [provider]  doxycycline  (VIBRA -TABS) 100 MG tablet Take 100 mg by mouth 2 (two) times daily. 06/17/23  Yes [provider]  famotidine (PEPCID) 20 MG tablet Take 1 tablet (20 mg total) by mouth every evening. 04/19/24  Yes Maryruth Sol, FNP  HYDROcodone -acetaminophen  (NORCO/VICODIN) 5-325 MG tablet Take 1 tablet by mouth every 4 (four) hours as needed. 05/02/23  Yes [provider]  ibuprofen (ADVIL) 400 MG tablet Take 400 mg by mouth every 6 (six) hours as needed. 03/20/23  Yes [provider]   medroxyPROGESTERone (DEPO-PROVERA) 150 MG/ML injection Inject 150 mg into the muscle every 3 (three) months. 11/13/22  Yes [provider]  ondansetron  (ZOFRAN -ODT) 4 MG disintegrating tablet Take 1 tablet (4 mg total) by mouth every 8 (eight) hours as needed for nausea or vomiting. 04/19/24  Yes Maryruth Sol, FNP  phentermine 15 MG capsule Take 15 mg by mouth every morning. 06/13/23  Yes [provider]  predniSONE (DELTASONE) 20 MG tablet Take 20 mg by mouth daily. 04/03/24  Yes [provider]  Rimegepant Sulfate (NURTEC) 75 MG TBDP Take 1 tablet by mouth daily. 10/16/22  Yes [provider]  SUMAtriptan  (IMITREX ) 100 MG tablet Take 100 mg by mouth every 2 (two) hours as needed. 09/06/22  Yes [provider]  triamcinolone cream (KENALOG) 0.1 % Apply 1 Application topically 2 (two) times daily. 12/18/22  Yes [provider]  valACYclovir (VALTREX) 500 MG tablet Take 500 mg by mouth as needed. 09/10/18  Yes [provider]  escitalopram (LEXAPRO) 10 MG tablet Take 10 mg by mouth daily.   Yes [provider]  fluticasone  (FLONASE ) 50 MCG/ACT nasal spray Place 1 spray into both nostrils daily. 02/15/23   Dodson Freestone, FNP  levothyroxine  (SYNTHROID ) 100 MCG tablet Take 100 mcg by mouth every morning.   Yes [provider]  lisinopril  (ZESTRIL ) 20 MG tablet Take 20 mg by mouth daily.   Yes [provider]  medroxyPROGESTERone Acetate 150 MG/ML SUSY Inject 1 mL into the muscle every 3 (three) months.    [provider]  nystatin (MYCOSTATIN/NYSTOP) powder Apply 1 Application topically daily at 2 PM.    [provider]  pantoprazole (PROTONIX) 40 MG tablet Take 40 mg by mouth daily.   Yes [provider]  traZODone (DESYREL) 50 MG tablet Take 50 mg by mouth at bedtime.    [provider]    Family History Family History  Problem Relation Age of Onset   Uterine cancer  Mother     Social History Social History   Tobacco Use   Smoking status: Never   Smokeless tobacco: Never  Vaping Use   Vaping status: Never Used  Substance Use Topics   Alcohol use: No   Drug use: No     Allergies   Patient has no known allergies.   Review of Systems Review of Systems  Constitutional:  Negative for fever.  HENT:  Negative for sore throat and trouble swallowing.   Gastrointestinal:  Positive for nausea. Negative for abdominal pain (pressure, burning in epigastric), blood in stool, constipation, diarrhea and vomiting.  Genitourinary:  Positive for urgency. Negative for dysuria and frequency.  All other systems reviewed and are negative.    Physical Exam Triage Vital Signs ED Triage Vitals  Encounter Vitals Group  BP 04/19/24 0813 115/81     Systolic BP Percentile --      Diastolic BP Percentile --      Pulse Rate 04/19/24 0813 (!) 102     Resp 04/19/24 0813 18     Temp 04/19/24 0813 97.7 F (36.5 C)     Temp Source 04/19/24 0813 Oral     SpO2 04/19/24 0813 97 %     Weight 04/19/24 0809 167 lb (75.8 kg)     Height 04/19/24 0809 5' 1.5" (1.562 m)     Head Circumference --      Peak Flow --      Pain Score 04/19/24 0807 0     Pain Loc --      Pain Education --      Exclude from Growth Chart --    No data found.  Updated Vital Signs BP 115/81 (BP Location: Left Arm)   Pulse (!) 102   Temp 97.7 F (36.5 C) (Oral)   Resp 18   Ht 5' 1.5" (1.562 m)   Wt 167 lb (75.8 kg)   LMP  (LMP Unknown)   SpO2 97%   BMI 31.04 kg/m   Visual Acuity Right Eye Distance:   Left Eye Distance:   Bilateral Distance:    Right Eye Near:   Left Eye Near:    Bilateral Near:     Physical Exam Vitals and nursing note reviewed.  Constitutional:      General: She is awake. She is not in acute distress.    Appearance: Normal appearance. She is well-developed. She is not ill-appearing or toxic-appearing.  HENT:     Head: Normocephalic.     Mouth/Throat:      Mouth: Mucous membranes are moist.  Eyes:     Conjunctiva/sclera: Conjunctivae normal.  Cardiovascular:     Rate and Rhythm: Normal rate and regular rhythm.     Heart sounds: Normal heart sounds.  Pulmonary:     Effort: Pulmonary effort is normal.     Breath sounds: Normal breath sounds and air entry.  Abdominal:     General: Bowel sounds are normal. There is no distension.     Palpations: Abdomen is soft.     Tenderness: There is no abdominal tenderness.  Musculoskeletal:        General: Normal range of motion.  Skin:    General: Skin is warm and dry.  Neurological:     General: No focal deficit present.     Mental Status: She is alert and oriented to person, place, and time.  Psychiatric:        Behavior: Behavior is cooperative.      UC Treatments / Results  Labs (all labs ordered are listed, but only abnormal results are displayed) Labs Reviewed  POCT URINALYSIS DIP (MANUAL ENTRY) - Abnormal; Notable for the following components:      Result Value   Clarity, UA cloudy (*)    Protein Ur, POC =30 (*)    All other components within normal limits  POCT URINE PREGNANCY - Normal    EKG   Radiology No results found.  Procedures Procedures (including critical care time)  Medications Ordered in UC Medications  alum & mag hydroxide-simeth (MAALOX/MYLANTA) 200-200-20 MG/5ML suspension 30 mL (30 mLs Oral Given 04/19/24 0834)  lidocaine  (XYLOCAINE ) 2 % viscous mouth solution 15 mL (15 mLs Mouth/Throat Given 04/19/24 0834)  famotidine (PEPCID) tablet 20 mg (20 mg Oral Given 04/19/24 0834)    Initial Impression /  Assessment and Plan / UC Course  I have reviewed the triage vital signs and the nursing notes.  Pertinent labs & imaging results that were available during my care of the patient were reviewed by me and considered in my medical decision making (see chart for details).  Clinical Course as of 04/19/24 0859  Sun Apr 19, 2024  0851 Clarity, UA(!): cloudy [SM]     Clinical Course User Index [SM] Maryruth Sol, FNP    Patient presents with symptoms consistent with gastroesophageal reflux disease, including a burning sensation in the chest radiating to the stomach, tingling in the stomach, and nausea. Symptoms appear to worsen with the urge to urinate, possibly due to increased abdominal pressure. Symptoms are not clearly associated with meals and may improve when lying down. Patient has been taking pantoprazole but reports minimal relief. There are no associated symptoms such as fever, sore throat, dysphagia, vomiting, diarrhea, constipation, or urinary tract infection symptoms. A GI cocktail and Pepcid were administered in clinic with some symptom improvement. Patient advised to continue pantoprazole daily and was educated on the importance of consistent use for optimal effect. Pepcid 20 mg was prescribed for evening use, and Zofran  as needed for nausea. Over-the-counter antacids were recommended for immediate relief as needed. Lifestyle and dietary modifications were discussed. Patient advised to monitor symptoms and follow up with primary care in 10-14 days if no improvement. Reviewed red flag symptoms warranting ED evaluation.  Today's evaluation has revealed no signs of a dangerous process. Discussed diagnosis with patient and/or guardian. Patient and/or guardian aware of their diagnosis, possible red flag symptoms to watch out for and need for close follow up. Patient and/or guardian understands verbal and written discharge instructions. Patient and/or guardian comfortable with plan and disposition.  Patient and/or guardian has a clear mental status at this time, good insight into illness (after discussion and teaching) and has clear judgment to make decisions regarding their care  Documentation was completed with the aid of voice recognition software. Transcription may contain typographical errors. Final Clinical Impressions(s) / UC Diagnoses   Final  diagnoses:  Gastroesophageal reflux disease without esophagitis     Discharge Instructions      You were seen today for acid reflux, also known as gastroesophageal reflux disease (GERD). This condition happens when stomach acid flows back into your esophagus. In the clinic, you were given a GI cocktail, which helped improve your symptoms. Make sure to take the medications as prescribed to manage your condition. Along with the medication, it's important to make some lifestyle and dietary changes. Try eating smaller, more frequent meals instead of large ones. Avoid drinking a lot of liquid during meals, and try not to eat 2-3 hours before bedtime. Also, avoid lying down right after eating. Certain foods and drinks should be avoided, and I've attached a list of examples for you to follow. It may also help to raise the head of your bed and sleep on your left side at night. Keep track of your symptoms, and be sure to follow up with your primary care provider as needed.   ED Prescriptions     Medication Sig Dispense Auth. Provider   famotidine (PEPCID) 20 MG tablet Take 1 tablet (20 mg total) by mouth every evening. 14 tablet Nusaybah Ivie, [place], FNP   ondansetron  (ZOFRAN -ODT) 4 MG disintegrating tablet Take 1 tablet (4 mg total) by mouth every 8 (eight) hours as needed for nausea or vomiting. 12 tablet Maryruth Sol, FNP  PDMP not reviewed this encounter.   Beola Brazil [place], [place] 04/19/24 [phone]

## 2024-04-19 NOTE — ED Triage Notes (Signed)
"  Since Friday evening I have felt like I have acid reflux causing pain in my stomach and when the pain in my stomach happens I have an overwhelming need to pee". No dysuria. Some nausea at times when it happens, no vomiting. No dysuria.

## 2024-04-19 NOTE — Discharge Instructions (Addendum)
 You were seen today for acid reflux, also known as gastroesophageal reflux disease (GERD). This condition happens when stomach acid flows back into your esophagus. In the clinic, you were given a GI cocktail, which helped improve your symptoms. Make sure to take the medications as prescribed to manage your condition. Along with the medication, it's important to make some lifestyle and dietary changes. Try eating smaller, more frequent meals instead of large ones. Avoid drinking a lot of liquid during meals, and try not to eat 2-3 hours before bedtime. Also, avoid lying down right after eating. Certain foods and drinks should be avoided, and I've attached a list of examples for you to follow. It may also help to raise the head of your bed and sleep on your left side at night. Keep track of your symptoms, and be sure to follow up with your primary care provider as needed.

## 2024-04-21 DIAGNOSIS — D631 Anemia in chronic kidney disease: Secondary | ICD-10-CM | POA: Diagnosis not present

## 2024-04-21 DIAGNOSIS — M3214 Glomerular disease in systemic lupus erythematosus: Secondary | ICD-10-CM | POA: Diagnosis not present

## 2024-04-21 DIAGNOSIS — I10 Essential (primary) hypertension: Secondary | ICD-10-CM | POA: Diagnosis not present

## 2024-04-21 DIAGNOSIS — N189 Chronic kidney disease, unspecified: Secondary | ICD-10-CM | POA: Diagnosis not present

## 2024-04-21 DIAGNOSIS — R809 Proteinuria, unspecified: Secondary | ICD-10-CM | POA: Diagnosis not present

## 2024-06-17 DIAGNOSIS — M549 Dorsalgia, unspecified: Secondary | ICD-10-CM | POA: Diagnosis not present

## 2024-06-19 DIAGNOSIS — M545 Low back pain, unspecified: Secondary | ICD-10-CM | POA: Diagnosis not present

## 2024-06-19 DIAGNOSIS — M549 Dorsalgia, unspecified: Secondary | ICD-10-CM | POA: Diagnosis not present

## 2024-07-10 DIAGNOSIS — Z3042 Encounter for surveillance of injectable contraceptive: Secondary | ICD-10-CM | POA: Diagnosis not present

## 2024-07-13 DIAGNOSIS — R52 Pain, unspecified: Secondary | ICD-10-CM | POA: Diagnosis not present

## 2024-07-13 DIAGNOSIS — R5383 Other fatigue: Secondary | ICD-10-CM | POA: Diagnosis not present

## 2024-07-22 DIAGNOSIS — E039 Hypothyroidism, unspecified: Secondary | ICD-10-CM | POA: Diagnosis not present

## 2024-07-22 DIAGNOSIS — R051 Acute cough: Secondary | ICD-10-CM | POA: Diagnosis not present

## 2024-07-22 DIAGNOSIS — J069 Acute upper respiratory infection, unspecified: Secondary | ICD-10-CM | POA: Diagnosis not present

## 2024-07-22 DIAGNOSIS — Z0000 Encounter for general adult medical examination without abnormal findings: Secondary | ICD-10-CM | POA: Diagnosis not present

## 2024-07-31 DIAGNOSIS — R059 Cough, unspecified: Secondary | ICD-10-CM | POA: Diagnosis not present

## 2024-07-31 DIAGNOSIS — E039 Hypothyroidism, unspecified: Secondary | ICD-10-CM | POA: Diagnosis not present

## 2024-07-31 DIAGNOSIS — J309 Allergic rhinitis, unspecified: Secondary | ICD-10-CM | POA: Diagnosis not present

## 2024-07-31 DIAGNOSIS — F418 Other specified anxiety disorders: Secondary | ICD-10-CM | POA: Diagnosis not present

## 2024-07-31 DIAGNOSIS — Z0000 Encounter for general adult medical examination without abnormal findings: Secondary | ICD-10-CM | POA: Diagnosis not present

## 2024-07-31 DIAGNOSIS — I10 Essential (primary) hypertension: Secondary | ICD-10-CM | POA: Diagnosis not present

## 2024-07-31 DIAGNOSIS — Z1389 Encounter for screening for other disorder: Secondary | ICD-10-CM | POA: Diagnosis not present

## 2024-07-31 DIAGNOSIS — M3214 Glomerular disease in systemic lupus erythematosus: Secondary | ICD-10-CM | POA: Diagnosis not present

## 2024-07-31 DIAGNOSIS — G43009 Migraine without aura, not intractable, without status migrainosus: Secondary | ICD-10-CM | POA: Diagnosis not present

## 2024-08-05 DIAGNOSIS — J40 Bronchitis, not specified as acute or chronic: Secondary | ICD-10-CM | POA: Diagnosis not present

## 2024-08-05 DIAGNOSIS — R053 Chronic cough: Secondary | ICD-10-CM | POA: Diagnosis not present

## 2024-10-02 DIAGNOSIS — Z3042 Encounter for surveillance of injectable contraceptive: Secondary | ICD-10-CM | POA: Diagnosis not present
